# Patient Record
Sex: Female | Born: 1935 | Race: White | Hispanic: No | Marital: Married | Smoking: Former smoker
Health system: Southern US, Community
[De-identification: ages and names within clinical notes are randomized; demographics above are authoritative.]

## PROBLEM LIST (undated history)

## (undated) DIAGNOSIS — K5792 Diverticulitis of intestine, part unspecified, without perforation or abscess without bleeding: Secondary | ICD-10-CM

## (undated) DIAGNOSIS — E785 Hyperlipidemia, unspecified: Secondary | ICD-10-CM

## (undated) DIAGNOSIS — I2510 Atherosclerotic heart disease of native coronary artery without angina pectoris: Secondary | ICD-10-CM

## (undated) DIAGNOSIS — C50919 Malignant neoplasm of unspecified site of unspecified female breast: Secondary | ICD-10-CM

## (undated) DIAGNOSIS — I10 Essential (primary) hypertension: Secondary | ICD-10-CM

## (undated) DIAGNOSIS — J449 Chronic obstructive pulmonary disease, unspecified: Secondary | ICD-10-CM

## (undated) HISTORY — PX: COLOSTOMY: SHX63

## (undated) HISTORY — PX: TOTAL KNEE ARTHROPLASTY: SHX125

## (undated) HISTORY — DX: Atherosclerotic heart disease of native coronary artery without angina pectoris: I25.10

## (undated) HISTORY — PX: MASTECTOMY: SHX3

## (undated) HISTORY — DX: Chronic obstructive pulmonary disease, unspecified: J44.9

## (undated) HISTORY — DX: Essential (primary) hypertension: I10

## (undated) HISTORY — DX: Hyperlipidemia, unspecified: E78.5

## (undated) HISTORY — PX: CORONARY ARTERY BYPASS GRAFT: SHX141

## (undated) HISTORY — PX: CHOLECYSTECTOMY: SHX55

## (undated) HISTORY — PX: THYROIDECTOMY: SHX17

## (undated) HISTORY — DX: Malignant neoplasm of unspecified site of unspecified female breast: C50.919

## (undated) HISTORY — DX: Diverticulitis of intestine, part unspecified, without perforation or abscess without bleeding: K57.92

---

## 1997-08-06 ENCOUNTER — Emergency Department (HOSPITAL_COMMUNITY): Admission: EM | Admit: 1997-08-06 | Discharge: 1997-08-06 | Payer: Self-pay | Admitting: Emergency Medicine

## 1998-01-06 ENCOUNTER — Ambulatory Visit (HOSPITAL_COMMUNITY): Admission: RE | Admit: 1998-01-06 | Discharge: 1998-01-06 | Payer: Self-pay | Admitting: *Deleted

## 1998-01-06 ENCOUNTER — Encounter: Payer: Self-pay | Admitting: *Deleted

## 1998-03-22 ENCOUNTER — Ambulatory Visit (HOSPITAL_COMMUNITY): Admission: RE | Admit: 1998-03-22 | Discharge: 1998-03-22 | Payer: Self-pay | Admitting: *Deleted

## 1998-03-22 ENCOUNTER — Encounter: Payer: Self-pay | Admitting: *Deleted

## 1998-05-01 ENCOUNTER — Encounter: Payer: Self-pay | Admitting: Internal Medicine

## 1998-05-01 ENCOUNTER — Inpatient Hospital Stay (HOSPITAL_COMMUNITY): Admission: EM | Admit: 1998-05-01 | Discharge: 1998-05-03 | Payer: Self-pay | Admitting: Emergency Medicine

## 1998-09-30 ENCOUNTER — Emergency Department (HOSPITAL_COMMUNITY): Admission: EM | Admit: 1998-09-30 | Discharge: 1998-09-30 | Payer: Self-pay | Admitting: Emergency Medicine

## 1998-09-30 ENCOUNTER — Encounter: Payer: Self-pay | Admitting: *Deleted

## 1998-12-15 ENCOUNTER — Encounter: Payer: Self-pay | Admitting: Endocrinology

## 1998-12-15 ENCOUNTER — Ambulatory Visit (HOSPITAL_COMMUNITY): Admission: RE | Admit: 1998-12-15 | Discharge: 1998-12-15 | Payer: Self-pay | Admitting: Internal Medicine

## 2001-02-12 ENCOUNTER — Inpatient Hospital Stay (HOSPITAL_COMMUNITY): Admission: RE | Admit: 2001-02-12 | Discharge: 2001-02-18 | Payer: Self-pay | Admitting: Orthopedic Surgery

## 2001-02-18 ENCOUNTER — Inpatient Hospital Stay (HOSPITAL_COMMUNITY)
Admission: RE | Admit: 2001-02-18 | Discharge: 2001-02-21 | Payer: Self-pay | Admitting: Physical Medicine & Rehabilitation

## 2001-08-03 ENCOUNTER — Emergency Department (HOSPITAL_COMMUNITY): Admission: EM | Admit: 2001-08-03 | Discharge: 2001-08-03 | Payer: Self-pay | Admitting: Emergency Medicine

## 2002-04-05 ENCOUNTER — Emergency Department (HOSPITAL_COMMUNITY): Admission: EM | Admit: 2002-04-05 | Discharge: 2002-04-06 | Payer: Self-pay | Admitting: Emergency Medicine

## 2002-05-09 ENCOUNTER — Encounter: Payer: Self-pay | Admitting: Emergency Medicine

## 2002-05-09 ENCOUNTER — Inpatient Hospital Stay (HOSPITAL_COMMUNITY): Admission: EM | Admit: 2002-05-09 | Discharge: 2002-05-20 | Payer: Self-pay | Admitting: Emergency Medicine

## 2002-05-10 ENCOUNTER — Encounter: Payer: Self-pay | Admitting: Thoracic Surgery (Cardiothoracic Vascular Surgery)

## 2002-05-10 ENCOUNTER — Encounter: Payer: Self-pay | Admitting: *Deleted

## 2002-05-11 ENCOUNTER — Encounter: Payer: Self-pay | Admitting: Cardiothoracic Surgery

## 2002-05-12 ENCOUNTER — Encounter: Payer: Self-pay | Admitting: Cardiothoracic Surgery

## 2002-05-13 ENCOUNTER — Encounter: Payer: Self-pay | Admitting: Cardiothoracic Surgery

## 2002-05-14 ENCOUNTER — Encounter: Payer: Self-pay | Admitting: Cardiothoracic Surgery

## 2002-05-15 ENCOUNTER — Encounter: Payer: Self-pay | Admitting: Cardiothoracic Surgery

## 2002-06-04 ENCOUNTER — Encounter: Admission: RE | Admit: 2002-06-04 | Discharge: 2002-06-04 | Payer: Self-pay | Admitting: Cardiothoracic Surgery

## 2002-06-04 ENCOUNTER — Encounter: Payer: Self-pay | Admitting: Cardiothoracic Surgery

## 2002-11-15 ENCOUNTER — Emergency Department (HOSPITAL_COMMUNITY): Admission: EM | Admit: 2002-11-15 | Discharge: 2002-11-15 | Payer: Self-pay | Admitting: Emergency Medicine

## 2003-08-09 ENCOUNTER — Emergency Department (HOSPITAL_COMMUNITY): Admission: EM | Admit: 2003-08-09 | Discharge: 2003-08-10 | Payer: Self-pay | Admitting: Emergency Medicine

## 2003-08-10 ENCOUNTER — Encounter: Admission: RE | Admit: 2003-08-10 | Discharge: 2003-08-10 | Payer: Self-pay | Admitting: Orthopedic Surgery

## 2003-08-15 ENCOUNTER — Inpatient Hospital Stay (HOSPITAL_COMMUNITY): Admission: EM | Admit: 2003-08-15 | Discharge: 2003-08-19 | Payer: Self-pay | Admitting: *Deleted

## 2003-08-18 ENCOUNTER — Encounter (INDEPENDENT_AMBULATORY_CARE_PROVIDER_SITE_OTHER): Payer: Self-pay | Admitting: Specialist

## 2003-10-15 ENCOUNTER — Ambulatory Visit (HOSPITAL_COMMUNITY): Admission: RE | Admit: 2003-10-15 | Discharge: 2003-10-15 | Payer: Self-pay | Admitting: Endocrinology

## 2003-12-20 ENCOUNTER — Other Ambulatory Visit: Admission: RE | Admit: 2003-12-20 | Discharge: 2004-01-19 | Payer: Self-pay | Admitting: Cardiology

## 2004-05-10 ENCOUNTER — Encounter: Admission: RE | Admit: 2004-05-10 | Discharge: 2004-05-10 | Payer: Self-pay | Admitting: *Deleted

## 2004-06-18 ENCOUNTER — Inpatient Hospital Stay (HOSPITAL_COMMUNITY): Admission: EM | Admit: 2004-06-18 | Discharge: 2004-06-23 | Payer: Self-pay | Admitting: Emergency Medicine

## 2004-06-21 ENCOUNTER — Encounter (INDEPENDENT_AMBULATORY_CARE_PROVIDER_SITE_OTHER): Payer: Self-pay | Admitting: *Deleted

## 2004-07-06 ENCOUNTER — Encounter: Payer: Self-pay | Admitting: Interventional Radiology

## 2004-08-04 ENCOUNTER — Inpatient Hospital Stay (HOSPITAL_COMMUNITY): Admission: AD | Admit: 2004-08-04 | Discharge: 2004-08-06 | Payer: Self-pay | Admitting: Internal Medicine

## 2004-09-19 ENCOUNTER — Emergency Department (HOSPITAL_COMMUNITY): Admission: EM | Admit: 2004-09-19 | Discharge: 2004-09-19 | Payer: Self-pay | Admitting: *Deleted

## 2005-04-12 ENCOUNTER — Emergency Department (HOSPITAL_COMMUNITY): Admission: EM | Admit: 2005-04-12 | Discharge: 2005-04-12 | Payer: Self-pay | Admitting: Emergency Medicine

## 2005-07-12 IMAGING — CR DG THORACIC SPINE 2V
4 series · 4 of 4 positions shown · non-contrast
Comparison: Chest CT of same day and chest radiograph of 06/04/02.

CLINICAL DATA: Urinary tract infection.  Midthoracic back pain that radiates to front of chest. 
 THORACIC SPINE - 4 VIEW - 06/18/04:

[view not recorded (1 of 4)]
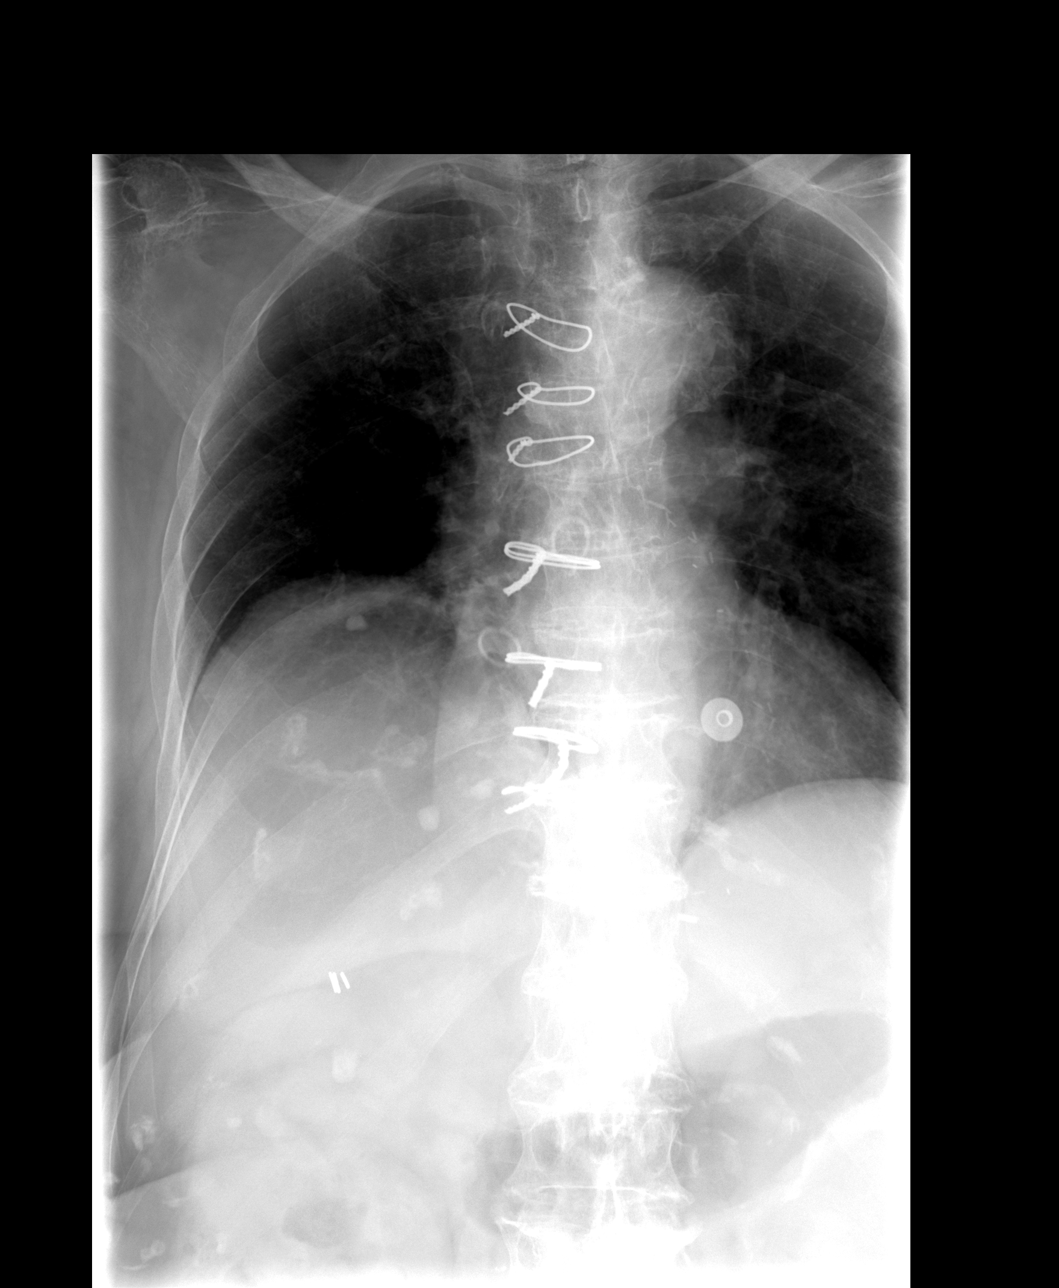

[view not recorded (2 of 4)]
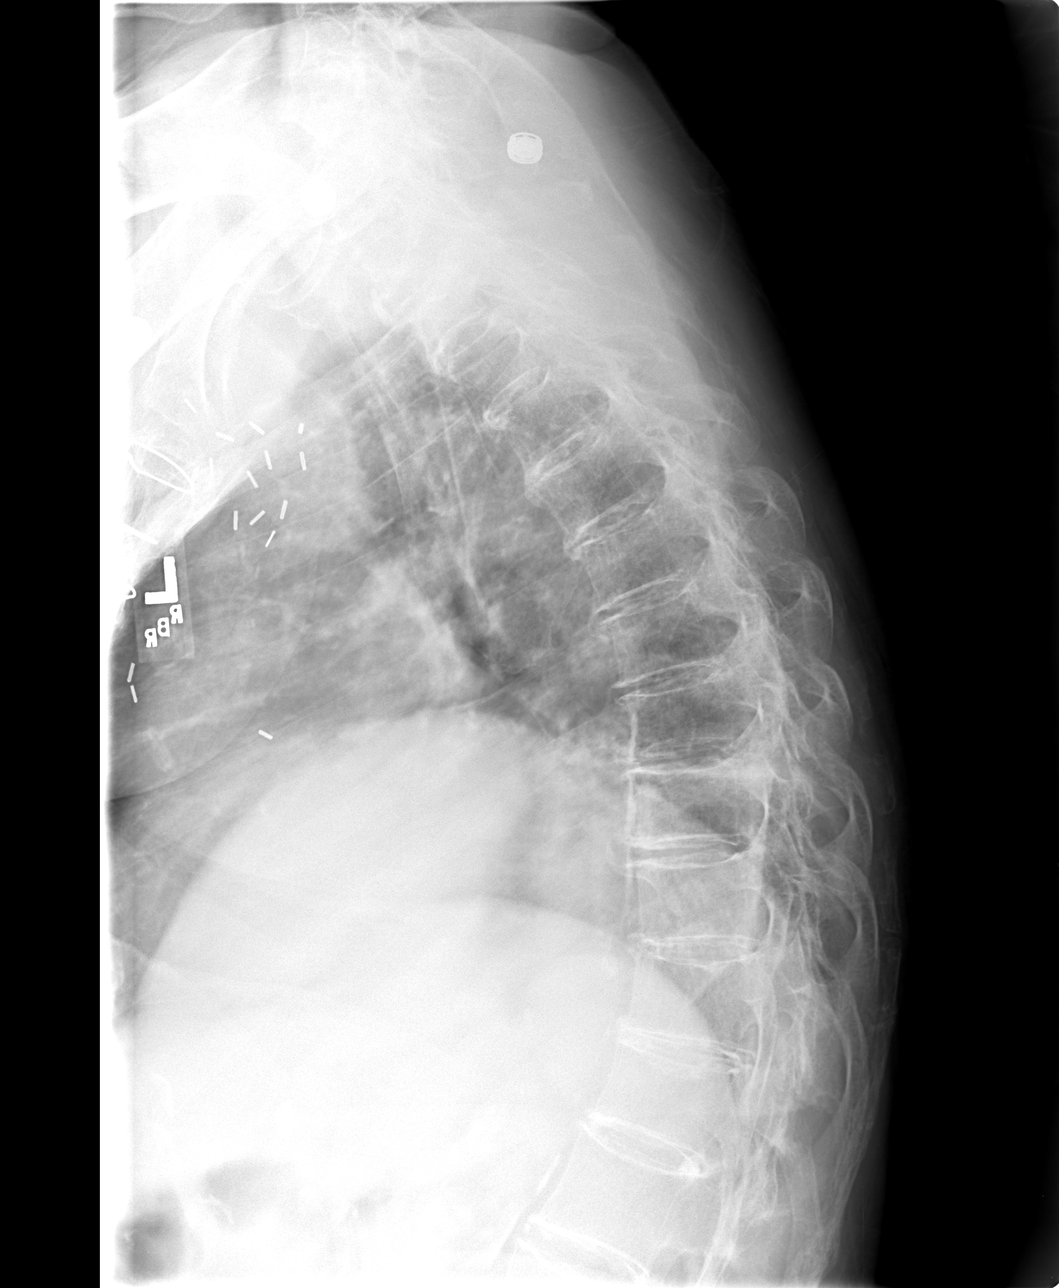

[view not recorded (3 of 4)]
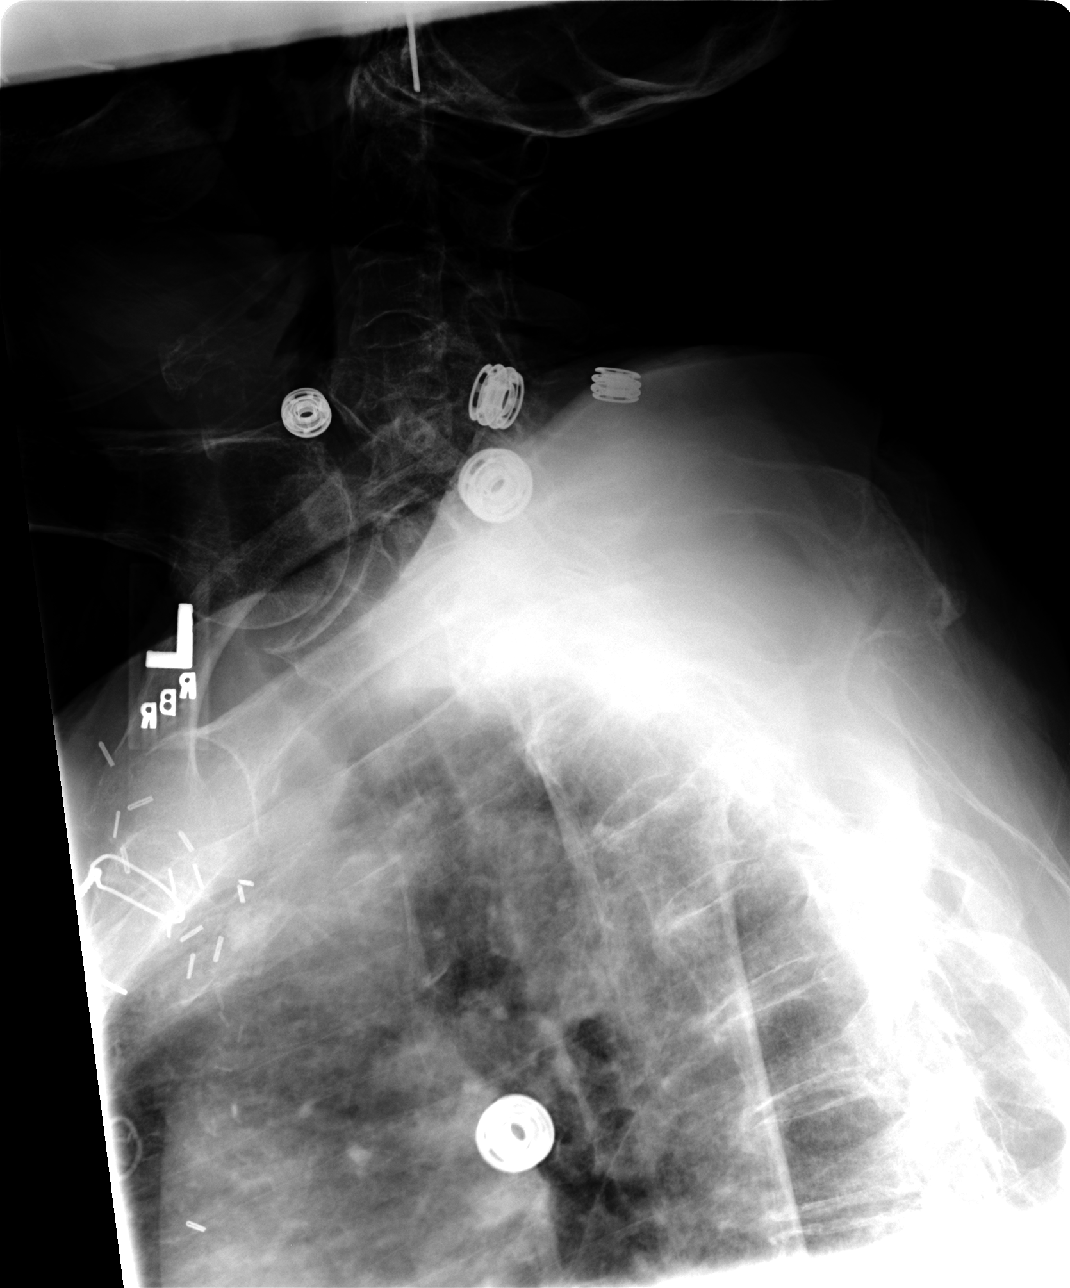

[view not recorded (4 of 4)]
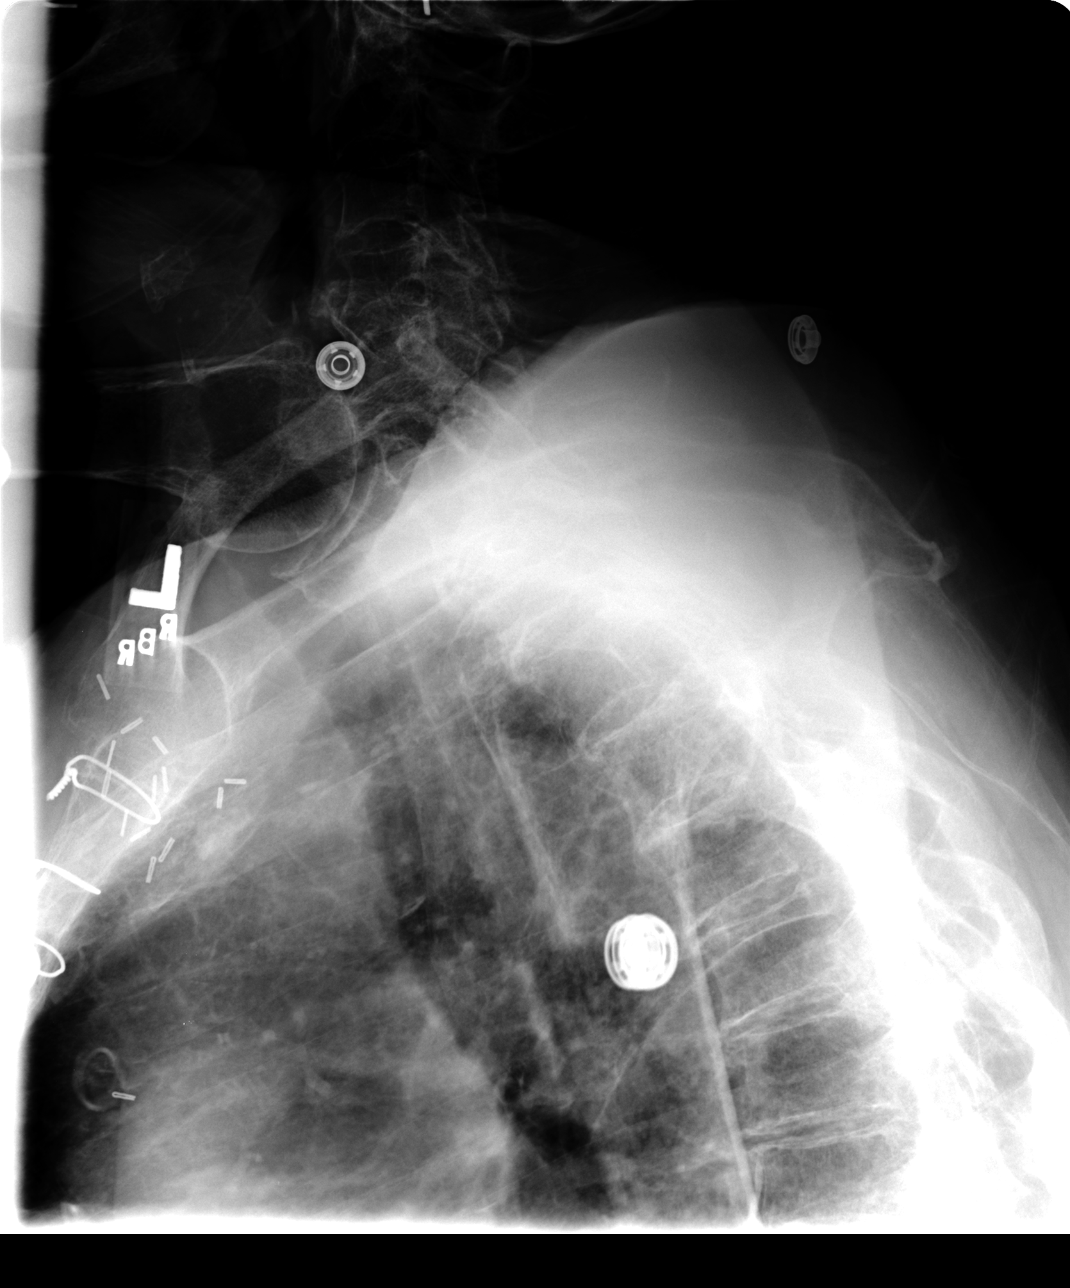

[4 of 4 positions shown; findings below may reference images not displayed]

FINDINGS: The patient is status post median sternotomy for CABG.  Venous graft markers are in place.  Seven median sternotomy wires are intact.  Multiple surgical clips are noted in the region of the left internal mammary artery.   There is some exaggeration of the normal thoracic kyphosis without focal fracture.  Atherosclerotic changes are noted in the thoracic aorta.
IMPRESSION: 1.  Degenerative changes of the thoracic spine.  Exaggerated kyphosis but no acute fracture. 
 2.  Previous median sternotomy for CABG. 
 3.  Status post cholecystectomy.

## 2005-07-12 IMAGING — CR DG LUMBAR SPINE 2-3V
3 series · 3 of 3 positions shown · non-contrast
Comparison: MRI lumbar spine 08/10/2003.

CLINICAL DATA: History of L3 compression fracture with vertebroplasty. Now with
mid and lower back pain.

LUMBAR SPINE - 3 VIEW 06/18/2004:

[view not recorded (1 of 3)]
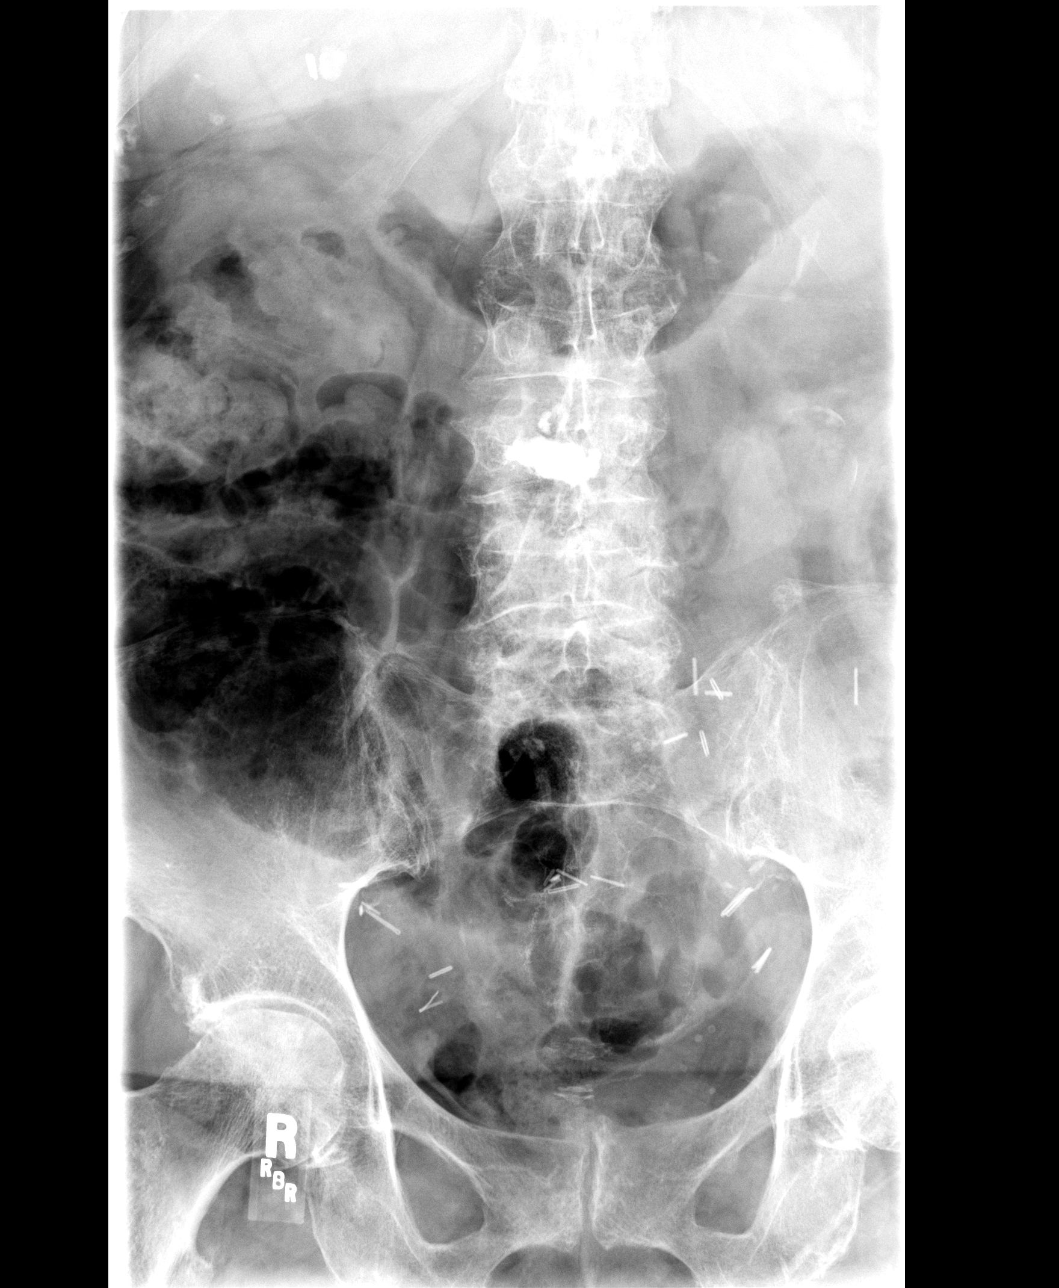

[view not recorded (2 of 3)]
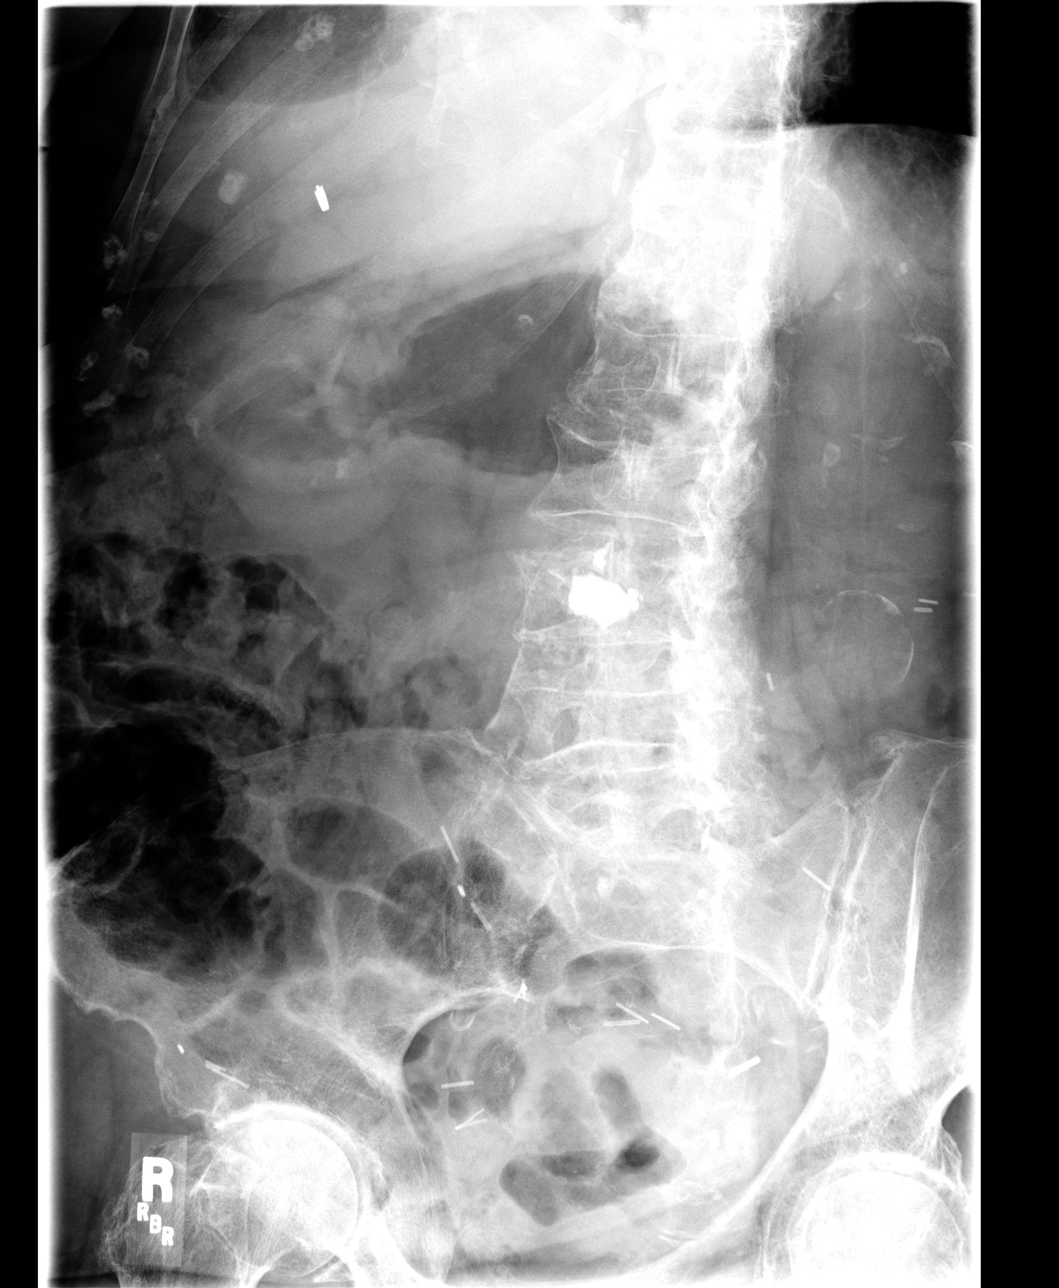

[view not recorded (3 of 3)]
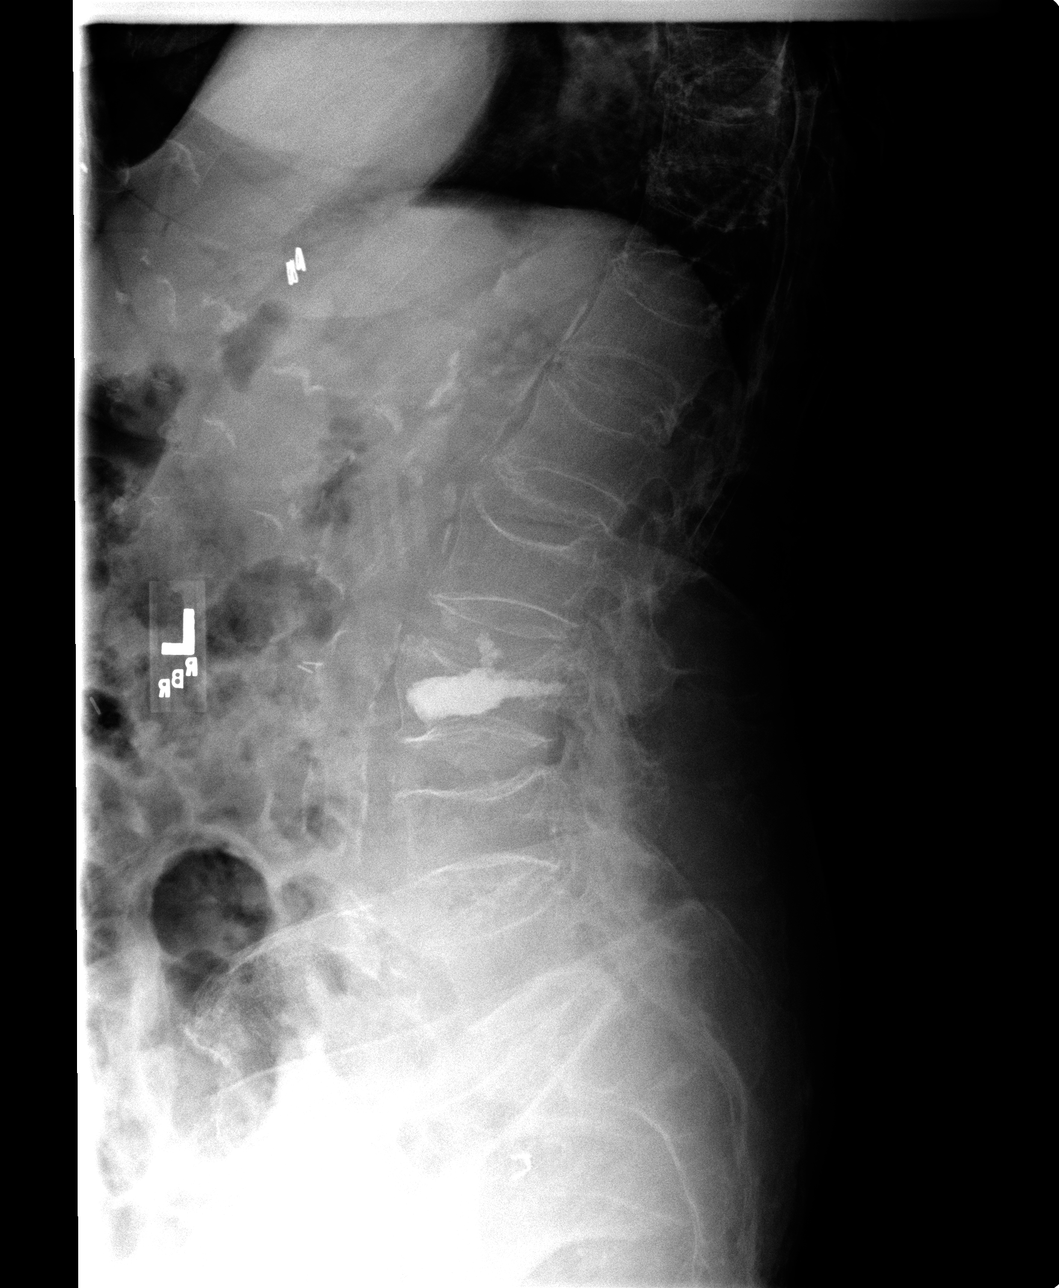

[3 of 3 positions shown; findings below may reference images not displayed]

I will use the same numbering scheme
where the last lumbar segment is designated L5.
FINDINGS: Since the prior MRI, patient has developed compression fractures of
the L4 vertebral body (40-50%), the upper endplate of L2 (approximately 30%),
the L1 body (approximately 40%), the T12 body (approximately 50%), the T11 body
(50-60%), and the T10 body (approximately 50%). The fracture at L3 has been
stabilized after vertebroplasty. Posterior alignment remains anatomic. Severe
diffuse osteopenia is noted.
IMPRESSION: Multiple new compression fractures since the prior MRI July 2003 as detailed
above.

## 2006-07-01 ENCOUNTER — Emergency Department (HOSPITAL_COMMUNITY): Admission: EM | Admit: 2006-07-01 | Discharge: 2006-07-01 | Payer: Self-pay | Admitting: Emergency Medicine

## 2006-09-26 ENCOUNTER — Emergency Department (HOSPITAL_COMMUNITY): Admission: EM | Admit: 2006-09-26 | Discharge: 2006-09-26 | Payer: Self-pay | Admitting: Emergency Medicine

## 2007-06-06 ENCOUNTER — Encounter (INDEPENDENT_AMBULATORY_CARE_PROVIDER_SITE_OTHER): Payer: Self-pay | Admitting: Radiology

## 2007-06-06 ENCOUNTER — Encounter: Admission: RE | Admit: 2007-06-06 | Discharge: 2007-06-06 | Payer: Self-pay | Admitting: General Surgery

## 2007-06-18 ENCOUNTER — Ambulatory Visit: Payer: Self-pay | Admitting: Oncology

## 2007-06-25 LAB — CANCER ANTIGEN 27.29: CA 27.29: 21 U/mL (ref 0–39)

## 2007-06-25 LAB — CBC WITH DIFFERENTIAL/PLATELET
Basophils Absolute: 0.1 10*3/uL (ref 0.0–0.1)
EOS%: 5.3 % (ref 0.0–7.0)
HCT: 38.6 % (ref 34.8–46.6)
HGB: 13.3 g/dL (ref 11.6–15.9)
LYMPH%: 23.4 % (ref 14.0–48.0)
MCH: 32.2 pg (ref 26.0–34.0)
MCV: 93.5 fL (ref 81.0–101.0)
MONO%: 8.1 % (ref 0.0–13.0)
NEUT%: 62.4 % (ref 39.6–76.8)
Platelets: 271 10*3/uL (ref 145–400)

## 2007-06-25 LAB — COMPREHENSIVE METABOLIC PANEL
AST: 19 U/L (ref 0–37)
Alkaline Phosphatase: 14 U/L — ABNORMAL LOW (ref 39–117)
BUN: 42 mg/dL — ABNORMAL HIGH (ref 6–23)
Creatinine, Ser: 2.36 mg/dL — ABNORMAL HIGH (ref 0.40–1.20)
Glucose, Bld: 91 mg/dL (ref 70–99)

## 2007-06-26 LAB — VITAMIN D 25 HYDROXY (VIT D DEFICIENCY, FRACTURES): Vit D, 25-Hydroxy: 26 ng/mL — ABNORMAL LOW (ref 30–89)

## 2007-06-27 ENCOUNTER — Encounter: Admission: RE | Admit: 2007-06-27 | Discharge: 2007-06-27 | Payer: Self-pay | Admitting: Oncology

## 2007-07-31 ENCOUNTER — Ambulatory Visit: Payer: Self-pay | Admitting: Oncology

## 2007-08-05 LAB — CBC WITH DIFFERENTIAL/PLATELET
BASO%: 0.3 % (ref 0.0–2.0)
Basophils Absolute: 0 10*3/uL (ref 0.0–0.1)
EOS%: 4.9 % (ref 0.0–7.0)
HCT: 37.2 % (ref 34.8–46.6)
HGB: 13.1 g/dL (ref 11.6–15.9)
LYMPH%: 20.4 % (ref 14.0–48.0)
MCH: 33.1 pg (ref 26.0–34.0)
MCHC: 35.3 g/dL (ref 32.0–36.0)
MCV: 93.7 fL (ref 81.0–101.0)
MONO%: 7.6 % (ref 0.0–13.0)
NEUT%: 66.8 % (ref 39.6–76.8)
Platelets: 257 10*3/uL (ref 145–400)
lymph#: 1.4 10*3/uL (ref 0.9–3.3)

## 2007-08-06 LAB — COMPREHENSIVE METABOLIC PANEL
AST: 15 U/L (ref 0–37)
BUN: 34 mg/dL — ABNORMAL HIGH (ref 6–23)
Calcium: 9.1 mg/dL (ref 8.4–10.5)
Chloride: 103 mEq/L (ref 96–112)
Creatinine, Ser: 2.24 mg/dL — ABNORMAL HIGH (ref 0.40–1.20)
Total Bilirubin: 0.7 mg/dL (ref 0.3–1.2)

## 2007-08-06 LAB — VITAMIN D 25 HYDROXY (VIT D DEFICIENCY, FRACTURES): Vit D, 25-Hydroxy: 35 ng/mL (ref 30–89)

## 2007-09-15 ENCOUNTER — Ambulatory Visit: Payer: Self-pay | Admitting: Oncology

## 2007-09-18 LAB — CBC WITH DIFFERENTIAL/PLATELET
Basophils Absolute: 0.1 10*3/uL (ref 0.0–0.1)
Eosinophils Absolute: 0.4 10*3/uL (ref 0.0–0.5)
HCT: 41 % (ref 34.8–46.6)
HGB: 13.8 g/dL (ref 11.6–15.9)
MCV: 96 fL (ref 81.0–101.0)
MONO%: 6.8 % (ref 0.0–13.0)
NEUT#: 3.7 10*3/uL (ref 1.5–6.5)
NEUT%: 63.9 % (ref 39.6–76.8)
Platelets: 245 10*3/uL (ref 145–400)
RDW: 12.9 % (ref 11.3–14.5)

## 2007-09-18 LAB — BASIC METABOLIC PANEL
BUN: 34 mg/dL — ABNORMAL HIGH (ref 6–23)
Chloride: 103 mEq/L (ref 96–112)
Creatinine, Ser: 2.09 mg/dL — ABNORMAL HIGH (ref 0.40–1.20)
Glucose, Bld: 100 mg/dL — ABNORMAL HIGH (ref 70–99)
Potassium: 4.4 mEq/L (ref 3.5–5.3)

## 2007-10-30 LAB — CBC WITH DIFFERENTIAL/PLATELET
BASO%: 0.7 % (ref 0.0–2.0)
Basophils Absolute: 0 10*3/uL (ref 0.0–0.1)
EOS%: 7.4 % — ABNORMAL HIGH (ref 0.0–7.0)
HCT: 39.2 % (ref 34.8–46.6)
HGB: 13.5 g/dL (ref 11.6–15.9)
LYMPH%: 19.2 % (ref 14.0–48.0)
MCH: 32.6 pg (ref 26.0–34.0)
MCHC: 34.3 g/dL (ref 32.0–36.0)
MCV: 95 fL (ref 81.0–101.0)
MONO%: 7 % (ref 0.0–13.0)
NEUT%: 65.7 % (ref 39.6–76.8)

## 2007-10-30 LAB — COMPREHENSIVE METABOLIC PANEL
ALT: 10 U/L (ref 0–35)
AST: 17 U/L (ref 0–37)
Alkaline Phosphatase: 10 U/L — ABNORMAL LOW (ref 39–117)
BUN: 25 mg/dL — ABNORMAL HIGH (ref 6–23)
Calcium: 9.2 mg/dL (ref 8.4–10.5)
Creatinine, Ser: 2.05 mg/dL — ABNORMAL HIGH (ref 0.40–1.20)
Total Bilirubin: 0.6 mg/dL (ref 0.3–1.2)

## 2007-11-27 ENCOUNTER — Ambulatory Visit: Payer: Self-pay | Admitting: Oncology

## 2007-12-01 LAB — COMPREHENSIVE METABOLIC PANEL
Albumin: 3.9 g/dL (ref 3.5–5.2)
CO2: 25 mEq/L (ref 19–32)
Glucose, Bld: 93 mg/dL (ref 70–99)
Sodium: 142 mEq/L (ref 135–145)
Total Bilirubin: 0.5 mg/dL (ref 0.3–1.2)
Total Protein: 7.4 g/dL (ref 6.0–8.3)

## 2007-12-01 LAB — CBC WITH DIFFERENTIAL/PLATELET
Eosinophils Absolute: 0.3 10*3/uL (ref 0.0–0.5)
HCT: 38 % (ref 34.8–46.6)
HGB: 13.1 g/dL (ref 11.6–15.9)
LYMPH%: 20.7 % (ref 14.0–48.0)
MONO#: 0.5 10*3/uL (ref 0.1–0.9)
NEUT#: 4.1 10*3/uL (ref 1.5–6.5)
Platelets: 253 10*3/uL (ref 145–400)
RBC: 4.01 10*6/uL (ref 3.70–5.32)
WBC: 6.2 10*3/uL (ref 3.9–10.0)

## 2007-12-01 LAB — CANCER ANTIGEN 27.29: CA 27.29: 22 U/mL (ref 0–39)

## 2008-03-26 ENCOUNTER — Ambulatory Visit: Payer: Self-pay | Admitting: Oncology

## 2008-04-30 LAB — COMPREHENSIVE METABOLIC PANEL
Alkaline Phosphatase: 12 U/L — ABNORMAL LOW (ref 39–117)
BUN: 32 mg/dL — ABNORMAL HIGH (ref 6–23)
Creatinine, Ser: 2.07 mg/dL — ABNORMAL HIGH (ref 0.40–1.20)
Glucose, Bld: 88 mg/dL (ref 70–99)
Total Bilirubin: 0.5 mg/dL (ref 0.3–1.2)

## 2008-04-30 LAB — CBC WITH DIFFERENTIAL/PLATELET
Basophils Absolute: 0 10*3/uL (ref 0.0–0.1)
Eosinophils Absolute: 0.3 10*3/uL (ref 0.0–0.5)
HGB: 13.4 g/dL (ref 11.6–15.9)
LYMPH%: 22 % (ref 14.0–49.7)
MCH: 32.3 pg (ref 25.1–34.0)
MCV: 94.4 fL (ref 79.5–101.0)
MONO%: 7.2 % (ref 0.0–14.0)
NEUT#: 4.4 10*3/uL (ref 1.5–6.5)
Platelets: 244 10*3/uL (ref 145–400)

## 2008-07-10 ENCOUNTER — Inpatient Hospital Stay (HOSPITAL_COMMUNITY): Admission: EM | Admit: 2008-07-10 | Discharge: 2008-07-11 | Payer: Self-pay | Admitting: Emergency Medicine

## 2008-10-27 ENCOUNTER — Ambulatory Visit: Payer: Self-pay | Admitting: Oncology

## 2008-10-29 LAB — COMPREHENSIVE METABOLIC PANEL
ALT: 13 U/L (ref 0–35)
AST: 25 U/L (ref 0–37)
Albumin: 3.7 g/dL (ref 3.5–5.2)
Alkaline Phosphatase: 11 U/L — ABNORMAL LOW (ref 39–117)
BUN: 44 mg/dL — ABNORMAL HIGH (ref 6–23)
Creatinine, Ser: 2.51 mg/dL — ABNORMAL HIGH (ref 0.40–1.20)
Potassium: 4.7 mEq/L (ref 3.5–5.3)

## 2008-10-29 LAB — CBC WITH DIFFERENTIAL/PLATELET
BASO%: 0.6 % (ref 0.0–2.0)
Eosinophils Absolute: 0.3 10*3/uL (ref 0.0–0.5)
LYMPH%: 20.6 % (ref 14.0–49.7)
MCHC: 34.8 g/dL (ref 31.5–36.0)
MONO#: 0.4 10*3/uL (ref 0.1–0.9)
NEUT#: 4.3 10*3/uL (ref 1.5–6.5)
Platelets: 255 10*3/uL (ref 145–400)
RBC: 4.01 10*6/uL (ref 3.70–5.45)
RDW: 13.5 % (ref 11.2–14.5)
WBC: 6.2 10*3/uL (ref 3.9–10.3)

## 2008-10-30 LAB — CANCER ANTIGEN 27.29: CA 27.29: 18 U/mL (ref 0–39)

## 2008-10-30 LAB — VITAMIN D 25 HYDROXY (VIT D DEFICIENCY, FRACTURES): Vit D, 25-Hydroxy: 22 ng/mL — ABNORMAL LOW (ref 30–89)

## 2008-11-03 ENCOUNTER — Encounter: Admission: RE | Admit: 2008-11-03 | Discharge: 2008-11-03 | Payer: Self-pay | Admitting: Oncology

## 2009-04-18 ENCOUNTER — Ambulatory Visit: Payer: Self-pay | Admitting: Oncology

## 2009-04-20 LAB — COMPREHENSIVE METABOLIC PANEL
ALT: 10 U/L (ref 0–35)
AST: 18 U/L (ref 0–37)
Albumin: 4.4 g/dL (ref 3.5–5.2)
BUN: 37 mg/dL — ABNORMAL HIGH (ref 6–23)
CO2: 25 mEq/L (ref 19–32)
Calcium: 9.1 mg/dL (ref 8.4–10.5)
Chloride: 100 mEq/L (ref 96–112)
Potassium: 4.1 mEq/L (ref 3.5–5.3)

## 2009-04-20 LAB — CBC WITH DIFFERENTIAL/PLATELET
BASO%: 0.4 % (ref 0.0–2.0)
Basophils Absolute: 0 10*3/uL (ref 0.0–0.1)
EOS%: 4.8 % (ref 0.0–7.0)
HCT: 38.1 % (ref 34.8–46.6)
HGB: 13.2 g/dL (ref 11.6–15.9)
MCH: 33.3 pg (ref 25.1–34.0)
MONO#: 0.5 10*3/uL (ref 0.1–0.9)
NEUT%: 65.2 % (ref 38.4–76.8)
RDW: 13.9 % (ref 11.2–14.5)
WBC: 7.1 10*3/uL (ref 3.9–10.3)
lymph#: 1.5 10*3/uL (ref 0.9–3.3)

## 2009-04-20 LAB — CANCER ANTIGEN 27.29: CA 27.29: 19 U/mL (ref 0–39)

## 2009-07-20 ENCOUNTER — Ambulatory Visit: Payer: Self-pay | Admitting: Cardiovascular Disease

## 2009-07-20 ENCOUNTER — Inpatient Hospital Stay (HOSPITAL_COMMUNITY): Admission: EM | Admit: 2009-07-20 | Discharge: 2009-07-22 | Payer: Self-pay | Admitting: Emergency Medicine

## 2009-09-02 ENCOUNTER — Ambulatory Visit: Payer: Self-pay | Admitting: Cardiology

## 2009-09-16 ENCOUNTER — Ambulatory Visit: Payer: Self-pay | Admitting: Cardiology

## 2009-11-01 ENCOUNTER — Ambulatory Visit: Payer: Self-pay | Admitting: Oncology

## 2009-11-03 LAB — COMPREHENSIVE METABOLIC PANEL
Albumin: 3.9 g/dL (ref 3.5–5.2)
BUN: 34 mg/dL — ABNORMAL HIGH (ref 6–23)
CO2: 21 mEq/L (ref 19–32)
Calcium: 9 mg/dL (ref 8.4–10.5)
Chloride: 104 mEq/L (ref 96–112)
Glucose, Bld: 132 mg/dL — ABNORMAL HIGH (ref 70–99)
Potassium: 4.2 mEq/L (ref 3.5–5.3)
Sodium: 140 mEq/L (ref 135–145)
Total Protein: 7.1 g/dL (ref 6.0–8.3)

## 2009-11-03 LAB — CBC WITH DIFFERENTIAL/PLATELET
Basophils Absolute: 0 10*3/uL (ref 0.0–0.1)
Eosinophils Absolute: 0.3 10*3/uL (ref 0.0–0.5)
HGB: 12.8 g/dL (ref 11.6–15.9)
MCV: 94.8 fL (ref 79.5–101.0)
MONO#: 0.5 10*3/uL (ref 0.1–0.9)
MONO%: 7.8 % (ref 0.0–14.0)
NEUT#: 3.7 10*3/uL (ref 1.5–6.5)
RBC: 3.78 10*6/uL (ref 3.70–5.45)
RDW: 13.9 % (ref 11.2–14.5)
WBC: 5.9 10*3/uL (ref 3.9–10.3)

## 2009-11-03 LAB — LACTATE DEHYDROGENASE: LDH: 139 U/L (ref 94–250)

## 2009-11-03 LAB — VITAMIN D 25 HYDROXY (VIT D DEFICIENCY, FRACTURES): Vit D, 25-Hydroxy: 29 ng/mL — ABNORMAL LOW (ref 30–89)

## 2009-11-14 LAB — BASIC METABOLIC PANEL
CO2: 30 mEq/L (ref 19–32)
Calcium: 10 mg/dL (ref 8.4–10.5)
Creatinine, Ser: 2.11 mg/dL — ABNORMAL HIGH (ref 0.40–1.20)
Sodium: 138 mEq/L (ref 135–145)

## 2010-02-05 ENCOUNTER — Encounter: Payer: Self-pay | Admitting: Oncology

## 2010-04-02 LAB — URINALYSIS, ROUTINE W REFLEX MICROSCOPIC
Bilirubin Urine: NEGATIVE
Glucose, UA: NEGATIVE mg/dL
Ketones, ur: NEGATIVE mg/dL
Protein, ur: 30 mg/dL — AB
pH: 7 (ref 5.0–8.0)

## 2010-04-02 LAB — BASIC METABOLIC PANEL
BUN: 22 mg/dL (ref 6–23)
Calcium: 8.5 mg/dL (ref 8.4–10.5)
GFR calc non Af Amer: 29 mL/min — ABNORMAL LOW (ref >60)
Glucose, Bld: 94 mg/dL (ref 70–99)
Sodium: 136 mEq/L (ref 135–145)

## 2010-04-02 LAB — COMPREHENSIVE METABOLIC PANEL
ALT: 11 U/L (ref 0–35)
AST: 21 U/L (ref 0–37)
Alkaline Phosphatase: 12 U/L — ABNORMAL LOW (ref 39–117)
CO2: 25 mEq/L (ref 19–32)
Calcium: 9.1 mg/dL (ref 8.4–10.5)
Chloride: 101 mEq/L (ref 96–112)
GFR calc Af Amer: 32 mL/min — ABNORMAL LOW (ref >60)
GFR calc non Af Amer: 27 mL/min — ABNORMAL LOW (ref >60)
Potassium: 4.2 mEq/L (ref 3.5–5.1)
Sodium: 136 mEq/L (ref 135–145)

## 2010-04-02 LAB — URINE MICROSCOPIC-ADD ON

## 2010-04-02 LAB — DIFFERENTIAL
Eosinophils Relative: 4 % (ref 0–5)
Lymphs Abs: 1.4 10*3/uL (ref 0.7–4.0)
Monocytes Absolute: 0.7 10*3/uL (ref 0.1–1.0)

## 2010-04-02 LAB — CARDIAC PANEL(CRET KIN+CKTOT+MB+TROPI)
CK, MB: 2.6 ng/mL (ref 0.3–4.0)
Relative Index: 1.9 (ref 0.0–2.5)

## 2010-04-02 LAB — CBC
Hemoglobin: 13.6 g/dL (ref 12.0–15.0)
MCHC: 33.9 g/dL (ref 30.0–36.0)
RBC: 4.19 MIL/uL (ref 3.87–5.11)
WBC: 7.5 10*3/uL (ref 4.0–10.5)

## 2010-04-02 LAB — POCT CARDIAC MARKERS
CKMB, poc: 1.4 ng/mL (ref 1.0–8.0)
Troponin i, poc: <0.05 ng/mL (ref 0.00–0.09)

## 2010-04-02 LAB — CK TOTAL AND CKMB (NOT AT ARMC)
CK, MB: 2.4 ng/mL (ref 0.3–4.0)
Total CK: 78 U/L (ref 7–177)

## 2010-04-24 LAB — POCT I-STAT, CHEM 8
HCT: 37 % (ref 36.0–46.0)
Hemoglobin: 12.6 g/dL (ref 12.0–15.0)
Potassium: 3.9 mEq/L (ref 3.5–5.1)
Sodium: 141 mEq/L (ref 135–145)
TCO2: 26 mmol/L (ref 0–100)

## 2010-04-24 LAB — DIFFERENTIAL
Basophils Absolute: 0 10*3/uL (ref 0.0–0.1)
Basophils Relative: 1 % (ref 0–1)
Lymphocytes Relative: 20 % (ref 12–46)
Monocytes Absolute: 0.5 10*3/uL (ref 0.1–1.0)
Monocytes Relative: 8 % (ref 3–12)
Neutro Abs: 4.4 10*3/uL (ref 1.7–7.7)
Neutrophils Relative %: 66 % (ref 43–77)

## 2010-04-24 LAB — CBC
HCT: 36 % (ref 36.0–46.0)
Hemoglobin: 12.3 g/dL (ref 12.0–15.0)
MCV: 96.8 fL (ref 78.0–100.0)
Platelets: 232 10*3/uL (ref 150–400)
RDW: 14 % (ref 11.5–15.5)

## 2010-04-24 LAB — LIPID PANEL
Cholesterol: 143 mg/dL (ref 0–200)
LDL Cholesterol: 88 mg/dL (ref 0–99)
Triglycerides: 100 mg/dL (ref <150)

## 2010-04-24 LAB — PROTIME-INR
INR: 1 (ref 0.00–1.49)
Prothrombin Time: 13.7 seconds (ref 11.6–15.2)

## 2010-04-24 LAB — COMPREHENSIVE METABOLIC PANEL
Albumin: 3.2 g/dL — ABNORMAL LOW (ref 3.5–5.2)
BUN: 32 mg/dL — ABNORMAL HIGH (ref 6–23)
Creatinine, Ser: 1.97 mg/dL — ABNORMAL HIGH (ref 0.4–1.2)
Glucose, Bld: 136 mg/dL — ABNORMAL HIGH (ref 70–99)
Total Bilirubin: 0.4 mg/dL (ref 0.3–1.2)
Total Protein: 6.7 g/dL (ref 6.0–8.3)

## 2010-05-24 ENCOUNTER — Other Ambulatory Visit: Payer: Self-pay | Admitting: Oncology

## 2010-05-24 ENCOUNTER — Encounter (HOSPITAL_BASED_OUTPATIENT_CLINIC_OR_DEPARTMENT_OTHER): Payer: Medicare Other | Admitting: Oncology

## 2010-05-24 DIAGNOSIS — N289 Disorder of kidney and ureter, unspecified: Secondary | ICD-10-CM

## 2010-05-24 DIAGNOSIS — I10 Essential (primary) hypertension: Secondary | ICD-10-CM

## 2010-05-24 DIAGNOSIS — M810 Age-related osteoporosis without current pathological fracture: Secondary | ICD-10-CM

## 2010-05-24 DIAGNOSIS — C50919 Malignant neoplasm of unspecified site of unspecified female breast: Secondary | ICD-10-CM

## 2010-05-24 DIAGNOSIS — C50419 Malignant neoplasm of upper-outer quadrant of unspecified female breast: Secondary | ICD-10-CM

## 2010-05-24 LAB — CBC WITH DIFFERENTIAL/PLATELET
Basophils Absolute: 0 10*3/uL (ref 0.0–0.1)
Eosinophils Absolute: 0.3 10*3/uL (ref 0.0–0.5)
HGB: 12.9 g/dL (ref 11.6–15.9)
MCV: 95.7 fL (ref 79.5–101.0)
MONO#: 0.7 10*3/uL (ref 0.1–0.9)
MONO%: 10.2 % (ref 0.0–14.0)
NEUT#: 4.7 10*3/uL (ref 1.5–6.5)
RBC: 4.04 10*6/uL (ref 3.70–5.45)
RDW: 14.4 % (ref 11.2–14.5)
WBC: 6.9 10*3/uL (ref 3.9–10.3)

## 2010-05-25 ENCOUNTER — Other Ambulatory Visit: Payer: Self-pay | Admitting: Oncology

## 2010-05-25 DIAGNOSIS — Z853 Personal history of malignant neoplasm of breast: Secondary | ICD-10-CM

## 2010-05-25 DIAGNOSIS — Z780 Asymptomatic menopausal state: Secondary | ICD-10-CM

## 2010-05-25 DIAGNOSIS — N63 Unspecified lump in unspecified breast: Secondary | ICD-10-CM

## 2010-05-25 LAB — COMPREHENSIVE METABOLIC PANEL
Albumin: 4 g/dL (ref 3.5–5.2)
Alkaline Phosphatase: 14 U/L — ABNORMAL LOW (ref 39–117)
BUN: 40 mg/dL — ABNORMAL HIGH (ref 6–23)
CO2: 21 mEq/L (ref 19–32)
Calcium: 9.7 mg/dL (ref 8.4–10.5)
Chloride: 103 mEq/L (ref 96–112)
Glucose, Bld: 106 mg/dL — ABNORMAL HIGH (ref 70–99)
Potassium: 4.2 mEq/L (ref 3.5–5.3)
Sodium: 140 mEq/L (ref 135–145)
Total Protein: 7 g/dL (ref 6.0–8.3)

## 2010-05-25 LAB — VITAMIN D 25 HYDROXY (VIT D DEFICIENCY, FRACTURES): Vit D, 25-Hydroxy: 37 ng/mL (ref 30–89)

## 2010-05-30 NOTE — H&P (Signed)
NAME:  Veronica Bowman, Veronica Bowman                  ACCOUNT NO.:  [id number]   MEDICAL RECORD NO.:  [id number]          PATIENT TYPE:  INP   LOCATION:  3033                         FACILITY:  [facility]   PHYSICIAN:  Mark A. Perini, M.D.   DATE OF BIRTH:  Sep 07, 1918   DATE OF ADMISSION:  07/09/2008  DATE OF DISCHARGE:                              HISTORY & PHYSICAL   CHIEF COMPLAINT:  Slurred speech, facial numbness.   HISTORY OF PRESENT ILLNESS:  Aunna is a pleasant 77 year old female with  multiple medical problems as listed below.  She reports 1-2 weeks'  history of increased right hand weakness and her family reports some  drooping of her mouth on that side for the last 1-2 weeks.  Yesterday,  she was more lethargic, somewhat more short of breath, and she also had  increased numbness around the right side of her face and therefore  presented to the emergency room.  She will be admitted for further  evaluation of possible stroke.   PAST MEDICAL HISTORY:  1. History of migraines.  2. Diverticulosis with past admission remotely for diverticulitis.  3. Cholecystectomy in 1977.  4. 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  5. Thyroid surgery in 1994.  6. In 2003, left knee replacement.  7. Skin cancer in 2005.  8. Pneumonia in 2006.  9. Congestive heart failure with systolic dysfunction with an ejection      fraction of 40-45%.  10.Left mastectomy for breast cancer in 1995 with no positive lymph      nodes and she took tamoxifen for 1 year at that time.  11.Atherosclerotic coronary disease status post six-vessel coronary      artery bypass grafting in 2004.  12.D&C in 1960s.  13.G3, P3 parity status with normal spontaneous vaginal deliveries.  14.Hypertension.  15.History of hypoglycemia.  16.Stage III/IV chronic kidney disease, last GFR 25 mL per minute.  17.In 2007, right lower lung calcified granuloma.  18.Out of facility  DNR order.  19.Allergic rhinitis.  20.She has an elevated right hemidiaphragm, which is known.  21.In April 2009, she had a right breast mass, which was diagnosed as      breast cancer and she has since been on Arimidex therapy.  No other      therapy given and her tumor has reportedly shrunk quite a bit.  22.Severe osteoporosis status post T7 and T10 vertebral fracture.  She      did take a 2-year course of Forteo in the past.   ALLERGIES:  PENICILLIN causes rash.  SULFA causes burn in mouth.  ANESTHESIA, she is intolerant.  PRILOSEC caused headache.  FEMARA made  her sick.  THYROID REPLACEMENT causes violent headaches.  BARBITURATE,  she cannot tolerate.  She cannot take DEMEROL or CODEINE and TAMOXIFEN  made her burn all over.   MEDICATIONS:  1. Metoprolol 50 mg daily.  2. Lasix one-half of 40 mg pill daily.  3. Over-the-counter potassium 99 mg daily.  4. 325 mg aspirin daily.  5.  Os-Cal with D twice a day.  6. Claritin 10 mg daily.  7. Triavil 2/100 one pill every evening.  8. DuoNeb twice a day.  9. Budesonide 0.5 mg twice a day, but she has not been using her      nebulizers lately.  10.Vitamin D 50,000 units each week, which she has been out of for the      last couple weeks.  11.Tylenol as needed.  12.Arimidex 1 mg daily.  13.Nitroglycerin patch one-half of a 0.4 mg patch daily.  14.Fenofibrate 200 mg daily.   SOCIAL HISTORY:  She was married in 76.  Her husband, Leonette Most is still  living, but is getting weaker an older and having more difficulties.  She has 2 living children, 1 child died.  She has 3 grandchildren and 6  great-grandchildren.   FAMILY HISTORY:  Father died at age 37 of a stroke.  Mother died at age  99 of a hernia operation.   REVIEW OF SYSTEMS:  For the last 5 or 6 decades, she occasionally had  headaches with decreased vision symptoms and she still occasionally has  episodes of decreased vision, but not the headache portion.  She has had  1-2  weeks of possible drooping mouth, although she does not report much  of a problem, her family has reported this.  She is short of breath all  the time and this is not much different, although it is perhaps slightly  worse now.  She denies any new chest pains.  She has had no new  swelling.  No blood from above or below.   PHYSICAL EXAMINATION:  VITAL SIGNS:  Temperature 97, pulse 57,  respiratory rate 16, blood pressure 138/63, 97% saturation on 2 L of  oxygen, weight is 81 kg.  GENERAL:  She is in no acute distress.  Speech is fluent.  HEENT:  Normocephalic, atraumatic.  No pallor.  No icterus.  There is a  little bit dysarthria noted and she does have some right facial droop  noted.  NECK:  There is no JVD.  LUNGS:  Clear to auscultation bilaterally with no wheezes, rales, or  rhonchi.  HEART:  Regular rate and rhythm with no murmur, rub, or gallop.  ABDOMEN:  Soft, nontender, nondistended.  No edema.   LABORATORY DATA:  White count 6.7, hemoglobin 12.3, platelet count  232,000.  Sodium 142, potassium 3.7, chloride 107, CO2 of 27, BUN 32,  creatinine 1.97.  GFR 24, glucose 136, alk phos 10, albumin 3.2.  LFTs  otherwise negative.  Total cholesterol 143, HDL 35, triglycerides 100,  LDL 88, total cholesterol HDL ratio of 4.1, INR 1.0.  Cranial CT scan  shows no acute findings.  She does have atrophy and some small-vessel  disease.   ASSESSMENT/PLAN:  An 77 year old female with coronary disease, breast  cancer with right facial numbness, right facial droop, and some slight  right hand weakness.  I suspect she has had a left brain stroke in the  last 1-2  weeks.  She may also have a brain metastasis.  We will check an MRI/MRA  of the brain.  We will check echo and carotid Dopplers.  We will check a  TSH.  We will continue her home medications.  We will add Lovenox for  DVT prophylaxis.  We will resume her nebulizers for her COPD and we have  confirmed that she is a DNR code  status.      Mark A. Perini, M.D.  Electronically  Signed     MAP/MEDQ  D:  07/10/2008  T:  07/10/2008  Job:  [id number]

## 2010-05-30 NOTE — Discharge Summary (Signed)
NAME:  Veronica Bowman, Veronica Bowman                  ACCOUNT NO.:  [id number]   MEDICAL RECORD NO.:  [id number]          PATIENT TYPE:  INP   LOCATION:  3033                         FACILITY:  [facility]   PHYSICIAN:  Mark A. Perini, M.D.   DATE OF BIRTH:  03-25-1918   DATE OF ADMISSION:  07/10/2008  DATE OF DISCHARGE:  07/11/2008                               DISCHARGE SUMMARY   DISCHARGE DIAGNOSES:  1. Slurred speech.  The patient has not had any confirmed subacute or      acute stroke; however, she was found on MRI to have some old      infarcts in the left cerebellar, occipital, and posterotemporal      lobe areas.  It is possible that when she gets more fatigued that      some deficit could come out occasionally in terms of some slurred      speech.  2. Breast cancer.  3. Hypertension.  4. Chronic obstructive pulmonary disease.  5. Osteoporosis.  6. Depression.  7. Vitamin D deficiency.  8. Atherosclerotic coronary disease.  9. Hyperlipidemia.  10.Colostomy.   PROCEDURES:  MRI/MRA of the brain which showed advanced atrophy and  white matter disease.  MRA showed some small vessel changes, but no  alarming findings, and again there was a remote left-sided lacune as  well as remote left cerebellar occipital/posterotemporal lobe infarcts.   DISCHARGE MEDICATIONS:  These are unchanged and she is to take  metoprolol 50 mg daily, Lasix 1/2 of a 40 mg pill daily, aspirin 325 mg  daily, Os-Cal with D twice a day with food, Claritin 10 mg daily,  Triavil 2/10 one pill each evening, DuoNeb nebulized at least once or  twice daily, budesonide nebulized 0.5 mg once daily, rinse mouth with  water after use, vitamin D 50,000 units weekly, nitroglycerin patch 0.4  mg per day patch 1/2 patch on in the morning and off at bedtime daily.  She is to resume fenofibrate 200 mg daily with food, Arimidex 1 mg  daily, Tylenol as needed, and over-the-counter potassium 99 mg pill  daily.   HISTORY OF PRESENT ILLNESS:   Veronica Bowman is a pleasant but ageing 77 year old  female with a history of COPD, congestive heart failure, and stage IV  chronic kidney disease as well as hypertension and chronic colostomy.  She reported 1-2 weeks history of some right hand weakness and possible  drooping of the mouth on the right side.  She also had some lethargy and  some shortness of breath.  She presented to the emergency room and it  was felt that she had some right-sided findings potentially on exam.  She was admitted for further care.   HOSPITAL COURSE:  Veronica Bowman was admitted to a telemetry bed.  She had no  concerning problems on telemetry.  She remained in normal sinus rhythm.  She underwent MRI/MRA with results as noted above.  She had no  neurologic events.  She felt at her baseline, and on July 11, 2008, she  was deemed stable for discharge home.   DISCHARGE PHYSICAL EXAMINATION:  VITAL  SIGNS:  Temperature 97.0,  afebrile, pulse 57, respiratory rate 18, blood pressure 123/56, 95%  saturation on 2 L of oxygen.  GENERAL:  She is in no acute distress.  Alert and oriented x4, able to  ambulate without difficulty with no assistive devices.  LUNGS:  Clear to auscultation bilaterally with no wheezes, rales, or  rhonchi.  HEART:  Regular rate and rhythm with no murmur, rub, or gallop.  There  is no peripheral edema.   DISCHARGE LABORATORY DATA:  Lipid profile showed a total cholesterol  143, triglycerides 100, HDL 35, LDL 88, total cholesterol to HDL ratio  of 4.1, GFR was estimated at 24 mL per minute.   DISCHARGE INSTRUCTIONS:  Veronica Bowman is to follow a low-salt, heart-healthy  diet.  She is to increase her activity slowly.  She is to call if she  has any recurrent problems and she will call our office for a follow-up  visit in 4 weeks.      Mark A. Perini, M.D.  Electronically Signed     MAP/MEDQ  D:  07/11/2008  T:  07/12/2008  Job:  [id number]

## 2010-05-31 ENCOUNTER — Telehealth: Payer: Self-pay | Admitting: Cardiology

## 2010-05-31 NOTE — Telephone Encounter (Signed)
No, she does not need to sign release.  I will fax records to Dr. Donnie Aho.

## 2010-05-31 NOTE — Telephone Encounter (Signed)
Wanted [place] to send her records to Dr.Tilley.  She is changing to him from [place]. Does she need to sign a release of info?

## 2010-06-02 ENCOUNTER — Other Ambulatory Visit: Payer: Medicare Other

## 2010-06-02 NOTE — Discharge Summary (Signed)
NAME:  Veronica Bowman, Veronica Bowman                  ACCOUNT NO.:  [id number]   MEDICAL RECORD NO.:  [id number]          PATIENT TYPE:  INP   LOCATION:  4703                         FACILITY:  [facility]   PHYSICIAN:  Mark A. Perini, M.D.   DATE OF BIRTH:  [birth year]   DATE OF ADMISSION:  08/04/2004  DATE OF DISCHARGE:  08/06/2004                                 DISCHARGE SUMMARY   DISCHARGE DIAGNOSES:  1.  Multifactorial dyspnea with contributions from systolic heart      dysfunction as well as diastolic dysfunction in addition to      deconditioning, chronically elevated right hemidiaphragm, history of      underlying coronary artery disease, and possibly a component of chronic      lung disease such as chronic obstructive pulmonary disease.  2.  Severe osteoporosis.  3.  History of colovesical fistula with left lower quadrant colostomy      currently.  No definite evidence of recurrent fistula as urine test done      this admission was fairly normal.  4.  Hypertension.  5.  Gastroesophageal reflux disease.  6.  Chronic renal insufficiency with an estimated creatinine clearance of      approximately 20-25 ml/minute.  7.  Mild hypoxia.   OPERATION/PROCEDURE:  CT scan of the chest PE protocol which showed no  pulmonary emboli.  There was little change noted in her ascending thoracic  aortic aneurysm which measures 4.6 x 4.2 cm in maximum dimension and she has  stable right diaphragmatic eventration in addition and right basilar chronic  atelectasis or scarring as well as stable large bilateral renal cysts.   DISCHARGE MEDICATIONS:  1.  Alprazolam 0.25 mg one up to twice daily as needed for anxiety or      dyspnea.  2.  Toprol-XL 50 mg one daily as before.  3.  Lasix 40 mg one-half tablet daily as before.  4.  Over-the-counter potassium 99 mg daily.  5.  Aspirin 325 mg daily.  6.  Os-Cal with D one tablet three times daily with food.  7.  Vicodin one to two tablets every six hours as needed for  pain.  8.  Occasional Antivert for dizziness.  9.  Ensure one can daily.  10. Claritin 10 mg daily as needed.  11. Prevacid 30 mg once daily with food.  12. DuoNeb nebulizer twice daily every day but she may use this up to four      times a day as needed.  13. Budesonide nebulizer 0.5 mg per 2 mL, nebulize twice daily.  14. Sublingual nitroglycerin 0.4 mg which she may attempt to use up to once      daily for chest pressure sensation.  She will report back her experience      with this to [place] in the office.   HISTORY OF PRESENT ILLNESS:  Veronica Bowman is a pleasant, but very complicated  77 year old female who presented to our office as a new patient.  At that  visit, she had significant dyspnea, dyspnea on exertion which she states has  been  progressive in the two to four weeks prior to her visit with me.  Given  her marginal oxygen saturations and her symptoms and history, it was felt  that she should be admitted for further evaluation.   HOSPITAL COURSE:  Veronica Bowman was admitted to a telemetry bed.  She remained  in normal sinus rhythm with PVCs during her entire stay.  There were no  significant arrhythmias noted.  Initial EKG and cardiac enzymes were not  suggestive of an acute coronary syndrome.  Veronica Bowman underwent a CT scan with  the results as noted above.  She was placed on azithromycin for three days  as well as nebulizer treatments and oxygen.  Her breathing seemed to improve  somewhat during her stay, albeit not that much.  She was able to ambulate  and her saturations with ambulation dropped to 89% but not quite far enough  to allow for home oxygen prescription.  By July 23, she was deemed stable  for discharge home.   DISCHARGE PHYSICAL EXAMINATION:  VITAL SIGNS:  Temperature 96.8, afebrile,  pulse 78, respiratory rate 20, blood pressure 157/81, 98% saturation on 2 L  of oxygen, 94% saturation on room air, weight 174.5 pounds.  GENERAL:  She was sitting, in no acute distress.   LUNGS:  Fairly clear to auscultation bilaterally with no significant  wheezing, rales or rhonchi.  HEART:  Regular rate and rhythm with no significant murmur.  ABDOMEN:  Soft, nontender.  There was no peripheral edema.   NOTABLE LABORATORY DATA:  On day of discharge, white count 6.4, with a  normal differential.  Hemoglobin 13.1, platelet count 228,000.  Sodium 142,  potassium 4.1, chloride 107, CO2 27, BUN 24, creatinine 1.5, albumin 3.3,  calcium 8.9, phosphorus  somewhat elevated at 5.  Urinalysis showed trace  leukocytes, small blood on the dipstick with only 0-2 white cells and 0-2  red cells on the microscopic exam.  Other notable laboratory data include a  low alk phos of 29, magnesium 2.1 on admission.  INR 1 on admission.  BNP  was 126 on admission.  Urine culture was pending at the time of discharge.   DISCHARGE INSTRUCTIONS:  Veronica Bowman is to be up with her cane or her walker.  She is to follow a low-salt diet.  She is to call if there are any recurrent  problems.  She is to call our office for followup visit in eight to nine  days.  We will try to address her osteoporosis further as an outpatient.  We  are sending a 25 hydroxy vitamin D level, a TSH and an intact PTH level  prior to her being discharged today.  We will set her up for a nocturnal  desaturation pulse oximetry test to see if she is dropping her oxygen level  at night and could maybe qualify for home oxygen use.  We will set her up  with a home nebulizer machine with nebulizer treatments as noted above.  She  reports a symptom of having a pressure feeling in her chest and a flush of  warmth comer over her whole body for several minutes after  each time she eats.  This could be related to reflux although it could  possibly be related to underlying coronary disease and we are giving her a  nitroglycerin that she can try up to once a day for this symptoms report back on how this does or does not help with this  symptom.  MAP/MEDQ  D:  08/06/2004  T:  08/07/2004  Job:  [id number]   cc:   Elmore Guise., M.D.  [address]  [place], [place] [address]  Fax: [phone]   Gita Kudo, M.D.  [address], [address]  [place]  [place] [address]   Grandville Silos. Corliss Skains, M.D.  [address]  [place]  [place] [address]  Fax: [phone]

## 2010-06-02 NOTE — H&P (Signed)
NAME:  Veronica Bowman, Veronica Bowman                  ACCOUNT NO.:  [id number]   MEDICAL RECORD NO.:  [id number]          PATIENT TYPE:  EMS   LOCATION:  MAJO                         FACILITY:  [facility]   PHYSICIAN:  Mallory Shirk, MD     DATE OF BIRTH:  1918/04/18   DATE OF ADMISSION:  06/17/2004  DATE OF DISCHARGE:                                HISTORY & PHYSICAL   CHIEF COMPLAINT:  Productive cough, shortness of breath, pleuritic chest  pain x 1 day.   HISTORY OF PRESENT ILLNESS:  Veronica Bowman is a very pleasant 77 year old  Caucasian woman with the one-day history of a productive cough, shortness of  breath, chronic back pain and pleuritic chest pain.  Patient states that she  has been traveling quite a bit with her family in a car.  Several of her  family members have had the cough and cold, 3 or 4 days prior to today.  Yesterday morning, she started having the cough and shortness of breath,  then got worse and worse and she had this pain in her lower rib cage on the  anterior side which was pretty severe, so she came to the emergency room for  evaluation.  During the time of exam, patient is alert and oriented, in no  apparent distress.  Patient states that she is allergic to morphine,  codeine, Talwin and, hence, would not like any narcotic pain medication.  In  fact, only pain medication she requested for was Tylenol.  Of note, on a  prior admission in August of 2005, patient was given Dilaudid for pain  control.  However, patient has elected not to receive it at this time.  Please note that patient had an acute L3 compression fracture, status post  L3 kyphoplasty in August of 2005.  Patient also had a colostomy and has a  colostomy bag.  This was done in January of 2006.  Details of this surgery  are not available at the time of this admission.  Dr. Ginette Pitman has done  the surgery.   PAST MEDICAL HISTORY:  1.  Coronary artery disease status post CABG.  2.  GERD.  3.  Breast CA.  4.  HTN.  5.   Osteoarthritis.  6.  Colostomy, January of 2006.   MEDICATIONS ON ADMISSION:  1.  Toprol XL 50 mg p.o. daily.  2.  Lasix 20 mg every other day.  3.  Aspirin 325 mg p.o. daily.   ALLERGIES:  Patient is allergic to CODEINE, MORPHINE, NORPRAMIN, PENICILLIN,  REGLAN, SULFA, TALWIN and ZANTAC.   FAMILY HISTORY:  Noncontributory.   SOCIAL HISTORY:  Patient lives with her husband.  Denies alcohol or tobacco  or drug use.  Patient is functional, able to ambulate and do ADLs  independently.   REVIEW OF SYSTEMS:  Other than HPI, chronic back pulse.  Otherwise,  negative.  More than 10 systems reviewed.   PHYSICAL EXAMINATION:  VITAL SIGNS:  On admission, blood pressure 188/99,  repeat 165/80.  Pulse 83, respirations 24, temperature 97.8.  GENERAL:  Elderly Caucasian woman, lying in  bed, in no acute distress.  Alert and oriented x 3.  HEENT:  Normocephalic, atraumatic.  PERRL.  Sclerae anicteric.  Mucous  membranes moist.  Oropharynx not erythematous.  NECK:  Supple.  No LAD, no JVD.  LUNGS:  Bibasilar crackles.  No wheezes.  CARDIOVASCULAR:  S1 plus S2.  Regular rate and rhythm.  No murmurs, rubs or  gallops.  ABDOMEN:  Soft.  Positive bowel sounds.  Colostomy bag in place.  Midline  surgical scar, healing nicely.  EXTREMITIES:  No cyanosis, clubbing or edema.  NEUROLOGIC:  Cranial nerves 2-12 grossly intact.  Sensory, motor within  normal limits.  No focal deficits seen.   Chest x-ray:  Mild cardiomegaly.  No active disease.  CT scan negative for  PE.  Chronic ascending aortic aneurysm.  No acute abnormalities.   LABORATORY:  On admission, WBC 7.9, hemoglobin 13.5, hematocrit 40.5.  MCV  91.2.  Point of care cardiac enzymes, negative.  D-dimer 2.38.  Sodium 142,  potassium 4.2, chloride 107, bicarb of 28, glucose 114, BUN 17, creatinine  1.6.  Calcium 8.8, total protein 7.9.  Albumin 3.3, AST 26, ALT 19, alkaline  phosphatase 23, total bili 0.7.  BNP 156.3.   ASSESSMENT AND PLAN:   An 77 year old Caucasian woman with history of  coronary artery disease, presenting with shortness of breath with a  productive cough, pleuritic chest pain, no pulmonary embolus by computed  tomography.  1.  Upper respiratory infection.  We will start the patient on Avelox 400 mg      IV daily.  We will also put her on guaifenesin 200 mg p.o. q.4h. for      symptomatic relief.  Also, patient has oxygen by nasal cannula for SATs      greater than 93%.  2.  Hypertension.  Patient's blood pressure is usually controlled with      Toprol.  While she is slightly hypertensive, will continue her Toprol      and monitor for the next 24 hours before either increasing the Toprol or      adding another antihypertensive.  3.  Gastroesophageal reflux disease.  Patient is on Protonix 40 mg p.o.      daily.  4.  Deep venous thrombosis prophylaxis.  Patient has SCD ordered.  5.  Pain management.  Patient, at this time, has requested only Tylenol.      She will get Tylenol  650 q.6h. p.r.n. pain.   DISPOSITION:  After resolution of acute symptoms, patient will be evaluated  by PT/OT and then subsequently discharged home with home health as  recommended by PT.      GDK/MEDQ  D:  06/18/2004  T:  06/18/2004  Job:  [id number]   cc:   Brooke Bonito, M.D.  [address]  [place]  [place] [address]  Fax: [phone]

## 2010-06-02 NOTE — Op Note (Signed)
[place]  Patient:    Veronica Bowman, Veronica Bowman Visit Number: [id number] MRN: [id number]          Service Type: SUR Location: [id number] Attending Physician:  Carolan Shiver Ii Dictated by:   Carlisle Beers. Dorothyann Gibbs, M.D. Proc. Date: 02/12/01 Admit Date:  02/12/2001                             Operative Report  PREOPERATIVE DIAGNOSIS:  End-stage osteoarthritis, left knee.  POSTOPERATIVE DIAGNOSIS:  End-stage osteoarthritis, left knee.  OPERATION PERFORMED:  Left LCS total knee replacement.  SURGEON:  John L. Dorothyann Gibbs, M.D.  ASSISTANTJerolyn Shin. Tresa Res, M.D.  ANESTHESIA:  General.  INDICATIONS FOR PROCEDURE:  The patient had bone against bone in the left knee.  She has had significant pain for over one year and all of the temporizing measures including over-the-counter pills, Ben-Gay, heating pad have failed to help her.  She is finally here for knee replacement.  DESCRIPTION OF PROCEDURE:  Under general anesthesia, the left leg was prepared with DuraPrep and draped as a sterile field.  A sterile proximal thigh tourniquet was applied.  The leg was wrapped out with an Esmarch and the tourniquet was used at 350 mHg.  A midline incision was made.  The patella was everted.  Multiple small vessels were cauterized.  The proximal tibia was resected after first sizing the femur.  The femur was a standard size.  The proximal tibial resection was carried out using standard guide and checking it with external guide.  At this point the first femoral guide was used for intercondylar drill hole placement.  The second guide was used for the resection of the anterior and posterior flare of the distal femur and with the 12.5 mm flexion gap.  It was necessary to release tight MCL from the tibia by subperiosteal stripping to balance the compartments.  The intramedullary guide was then used for a 12.5 extension gap.  The recessing guide was then used  and following this, the lamina spreader was used and remnants of cruciates and menisci were resected as well as loose body in the posterior recess and spurs off the back of the femoral condyles.  Once this was all debrided, the tibia was exposed with the [facility] and a Hohmann.  It was a size 3.  A center peg hole with fins was applied.  Trial reduction of size 3 tibia with a 12.5 bearing and a standard femur revealed excellent fit, alignment and stability. The patella was then osteotomized.  Three peg holes placed and trial patella revealed excellent tracking.  Permanent components were then obtained.  Bony surfaces were flushed with fresh irrigation and all components were put in place.  While the cement hardened, a synovectomy of the suprapatellar pouch was carried out.  At this point the tourniquet was let down at 48 minutes. The knee was carefully tested.  Excess cement was removed.  It was determined at this point that the tibial tray plastic was no longer as stable as it was just five minutes earlier and there was a tendency towards lateral bearing spin.  The bearing was removed and trialed with a 15 bearing was significantly and acceptably stable.  At this point, a 15 bearing was inserted and the 12.5 bearing was essentially wasted.  However, stability and alignment were excellent and at this point the wound was closed in layers with #  1 surgidac, 0 and 2-0 Vicryl and skin clips.  OPERATIVE TIME:  1 hour 10 minutes.  The patient tolerated the procedure well and returned to recovery in good condition.  A medium Hemovac drain was in the knee and placing the last two stitches, care was taken to assure that it was not sutured in.  The patient was transferred to the recovery room in good condition after application of a femoral nerve block. Dictated by:   Carlisle Beers. Dorothyann Gibbs, M.D. Attending Physician:  Carolan Shiver Ii DD:  02/12/01 TD:  02/12/01 Job: [id number]

## 2010-06-02 NOTE — Consult Note (Signed)
NAME:  Veronica Bowman, Veronica Bowman                  ACCOUNT NO.:  [id number]   MEDICAL RECORD NO.:  [id number]          PATIENT TYPE:  OUT   LOCATION:  XRAY                         FACILITY:  [facility]   PHYSICIAN:  Sanjeev K. Deveshwar, M.D.DATE OF BIRTH:  [birth year]   DATE OF CONSULTATION:  DATE OF DISCHARGE:                                   CONSULTATION   HISTORY OF PRESENT ILLNESS:  This is a pleasant 77 year old female with a  history of osteoporosis.  She had a kyphoplasty at the  L3 level performed in August 2005.  She recently had a kyphoplasty performed  by Dr. Corliss Skains at the T7 and T10 levels performed June 21, 2004.  The  patient is seen back today for follow-up. Biopsies from the recent  kyphoplasty as well as the kyphoplasty from 2005 were negative for  malignancy.   The patient is accompanied by her daughter today. She is continued to have  pain, however, her pain is improved from previously.  Her daughter recalls  that her previous pain following her intervention in August 2005 took about  four weeks to resolve.   CURRENT MEDICATIONS:  The patient is intolerant to many pain medications.  She is currently taking Vicodin but causes severe nausea.  She takes Tylenol  with some relief and she is intolerant to codeine, morphine, Darvocet,  sulfa, Talwin,  Zantac, Reglan and penicillin.  She is ice packs at home for  some relief as well.   PHYSICAL EXAMINATION:  The patient's kyphoplasty site is well-healed.  She  is pain-free while at rest, however she does have pain with activity.  She  is able to walk household distances with a walker.  She is having home  health physical therapy and the therapist has encouraged her to increase her  activity.  She notes a decreased appetite felt secondary to her pain  medications.  She had a previous bone density study apparently in March, we  do not have those results, but the patient reports that she has severe  osteoporosis.  She is not currently on  any medication for this problem.   PLAN:  Dr.Deveshwar is concerned as the patient is still having a fair  amount of pain.  He considered ordering another MRI, however the patient and  her daughter would like to give this situation a little more time to see if  her pain will improve on its known.  Dr. Joetta Manners the patient to  contact us in approximately2-4 weeks to keep [place] up-to-date on her condition.   We have also recommended that the patient called Dr. Waynard Edwards for evaluation  in his [facility] and to see if the patient would be a candidate  for the medication Forteo.   IMPRESSION:  1.  Status post compression fractures with kyphoplasties performed at the T7      and T10 levels on June 21, 2004.  2.  Previous kyphoplasty at theL3 level in August 2005.  3.  History of coronary artery disease with previous coronary artery bypass      graft surgery.  4.  Recent colostomy in January 2006.  5.  Osteoarthritis.  6.  Hypertension.  7.  History of breast carcinoma.  8.  Gastroesophageal reflux disease.  9.  Chronic dizziness.  10. Intolerant to multiple medications as listed above.      Markus.Osmond   DR/MEDQ  D:  07/06/2004  T:  07/06/2004  Job:  [id number]   cc:   Mallory Shirk, MD   Dr. Daiva Huge (?)   Gita Kudo, M.D.  [address], [address]  [place]  [place] [address]   Redge Gainer. Waynard Edwards, M.D.  [address]  [place]  [place] [address]  Fax: [phone]

## 2010-06-02 NOTE — Discharge Summary (Signed)
NAME:  Veronica Bowman, Veronica Bowman                  ACCOUNT NO.:  [id number]   MEDICAL RECORD NO.:  [id number]          PATIENT TYPE:  INP   LOCATION:  5732                         FACILITY:  [facility]   PHYSICIAN:  Sanjeev K. Deveshwar, M.D.DATE OF BIRTH:  05-21-1918   DATE OF ADMISSION:  06/17/2004  DATE OF DISCHARGE:  06/23/2004                                 DISCHARGE SUMMARY   PRIMARY CARE PHYSICIAN:  Dr. Juleen China.   CONSULTANTS:  Dr. Corliss Skains.   DISCHARGE DIAGNOSES:  1.  Upper respiratory infection.  2.  Multiple vertebral fractures.  3.  Coronary artery disease status post coronary artery bypass grafting.  4.  Gastroesophageal reflux disease.  5.  Breast cancer, history of.  6.  Hypertension.  7.  Osteoporosis/osteoarthritis.   DISCHARGE MEDICATIONS:  1.  Avelox 400 mg p.o. daily (finished 10 day course).  2.  Albuterol inhaler p.r.n. one puff q.4-6h.  3.  Os-Cal D two tablets p.o. daily.  4.  Toprol-XL 50 mg p.o. daily.  5.  Lasix 10 mg p.o. every other day.  6.  Aspirin 325 mg p.o. daily.  7.  Vicodin one to two tabs p.o. q.6 p.r.n.   DIAGNOSTIC STUDIES:  1.  MR of the thoracic and lumbar spines with and without contrast.      Impression:  Acute compression deformity of inferior endplate T7 and      superior endplate T10 superimposed upon degenerative changes as      described above.  2.  Renal cystic structures incompletely evaluated on present exam,      __________ followup imagery for further delineation.  3.  Lumbar spine without contrast showed old superior inferior compression      deformity of L3 vertebrae status post kyphoplasty August 18, 2003.  She      also has moderate multifactorial L3-L4 and L4-L5 spinal stenosis with      mild bilateral foraminal narrowing as shown above.  4.  On June 18, 2004, lumbar spine x-ray shows impressive multiple new      compression fractures since prior MRI in July 2005 showing L4 vertebral      body 40-50% compression fracture at the upper  endplate of L2      approximately 30% compression, L1 body approximately 40%, T12 body      approximately 50%, T11 body approximately 50-60%, and T10 body      approximately 50%.  The L3 fracture from prior study showed it has been      stabilized after vertebroplasty.  There is superior diffuse osteopenia      noted on this x-ray finding.  5.  On June 18, 2004, CT angiogram of chest showed no evidence of acute      pulmonary embolism.  There is a stable 4.3 cm aneurysm in the thoracic      aorta.  Also noted chronic elevation of the right hemidiaphragm and      right basilar scarring.   HOSPITAL COURSE:  Please see H&P for details of admission.   Problem 1. Upper respiratory infection being treated for a 10 day  course  with Avelox with known underlying probable COPD.  She states she uses  inhalers at home.  She does not smoke and has been afebrile after initiating  antibiotic therapy.  She had a productive cough and continues to have so,  but is moving air much better at this time compared to when she was first  admitted.  At this time, she is stable on room air with good O2 saturations  not requiring any accessory muscle use and breathing without any difficulty.   Problem 2. Multiple vertebral fractures.  Dr. Corliss Skains was consulted during  this admission.  MRI was performed with the findings as indicated above.  The patient was taken to surgery for repeat vertebroplasty to the affected  areas.  Please see Dr. Fatima Sanger operative note and consult note for  details.  Forty-eight hours after intervention, the patient continues to  have back pain although this has decreased.  IR note indicates that most  likely she will have pain for approximately two weeks after this  intervention.  At this time, she is able to sit and tolerate p.o. pain  medications which is adequate for control at this time.   Problem 3. Osteoporosis with diffuse osteopenia.  She is started on calcium  and vitamin  D supplementation in this hospitalization.  Would recommend  follow up DEXA scan with possible bisphosphonate therapy.   Problem 4. Hypertension/coronary artery  disease/osteoarthritis/gastroesophageal reflux disease.  The patient had no  issues during this hospitalization.  There were no changes in her  medications and continued to take home regimen as prior to admission.   DISPOSITION:  Stable with appropriate back pain status post vertebroplasty.  The patient has been afebrile with good O2 saturations and will be released  home with followup with Dr. Corliss Skains, as well as her primary care physician  in one to two weeks.       SKD/MEDQ  D:  06/23/2004  T:  06/23/2004  Job:  [id number]   cc:   Brooke Bonito, M.D.  [address]  [place]  [place] [address]  Fax: [phone]

## 2010-06-02 NOTE — Cardiovascular Report (Signed)
NAME:  Veronica Bowman, Veronica Bowman                            ACCOUNT NO.:  [id number]   MEDICAL RECORD NO.:  [id number]                   PATIENT TYPE:  INP   LOCATION:  2314                                 FACILITY:  [facility]   PHYSICIAN:  Darlin Priestly, M.D.             DATE OF BIRTH:  Jul 24, 1918   DATE OF PROCEDURE:  05/09/2002  DATE OF DISCHARGE:                              CARDIAC CATHETERIZATION   PROCEDURES PERFORMED:  1. Left heart catheterization.  2. Coronary angiography.  3. Left ventriculogram.  4. Abdominal aortogram.   CARDIOLOGIST:  Darlin Priestly, M.D.   COMPLICATIONS:  None.   INDICATIONS FOR PROCEDURE:  This patient is an 77 year old female, patient  of Dr. Charolette Child, with a history of hypertension, hypothyroidism and  questionable history of anterior wall MI in July of 2003 after a total knee  replacement.  The patient has had stuttering chest pain for one year.  The  patient presented to the ER on the morning of May 09, 2002 with recurrent  chest pain and noted to have anterior Q waves with ST elevation.  The  patient did have initial positive enzymes.  She is now referred for a  cardiac catheterization to assess her coronary status.   DESCRIPTION OF PROCEDURE:  After obtaining informed consent the patient was  brought to the cardiac cath lab in a fasting state.  The patient's right and  left groins were then shaved, prepped and draped in the usual sterile  fashion.  ECG monitoring was established.  Using the modified Seldinger  technique a #7 French arterial sheath was inserted into the right femoral  artery.  A 6 [place] JR4 diagnostic catheter was then used to visualize the  right coronary artery.  This revealed a large RCA, which is dominant and  gives rise to the PDA as well as the posterolateral branch.  He RCA has 40%  proximal and 70% distal disease.  The PDA is a medium size vessel with 70%  ostial lesion.  The PLA is a medium size vessel, which is  diffuse irregular  up to 30% with a 60% lesion the lower bifurcation.   The right catheter was then exchanged for a JL4 guiding catheter.  This was  inserted engaging the left coronary ostium and selective angiograms were  performed.  This revealed a large left main with no significant disease.  The LAD was a medium size vessel, which coursed the apex across two diagonal  branches.  The LAD was noted to be subtotally occluded in its early proximal  portion with visible thrombus.  There was sequential 90, 80 and 80% lesions  in the mid LAD with a 90% early distal LAD stenotic lesion.  The first  diagonal was a small vessel, which is subtotally occluded in it proximal  portion.  The second diagonal is a medium size vessel with no significant  disease.  The left circumflex is a large vessel that courses the AV groove  and gives rise to two obtuse marginal branches.  AV groove circumflex is  noted to have 70% and 60% midvessel stenotic lesions.  The first OM is a  medium size vessel with a native proximal lesion.  The second OM is a large  vessel with no significant disease.   The left ventriculogram revealed a moderately depressed EF of 30-35% with  anterior and apical hypokinesis.   Abdominal aortogram revealed 80% left renal artery stenosis and 50% right  renal artery stenosis.  There was noted to be distal aortic tapering with  mild-to-moderate infrarenal disease.   HEMODYNAMIC DATA:  Systemic arterial pressure 170/81.  LV systemic pressure  170/19.  LVEDP of 35.   CONCLUSION:  1. Significant three-vessel coronary artery disease.  2. Moderate depressed left ventricular systolic function with wall motion     abnormalities as noted above.  3. Eighty percent left renal artery stenosis and 50% right renal artery     stenosis.  4. Systemic hypertension.  5. Elevated left ventricular end diastolic pressure.                                                 Darlin Priestly,  M.D.    RHM/MEDQ  D:  05/09/2002  T:  05/11/2002  Job:  [id number]   cc:   Aram Candela. Aleen Campi, M.D.  [address]  [place]  [place] [address]  Fax: [phone]

## 2010-06-02 NOTE — Discharge Summary (Signed)
NAME:  Veronica Bowman, Veronica Bowman                            ACCOUNT NO.:  [id number]   MEDICAL RECORD NO.:  [id number]                   PATIENT TYPE:  INP   LOCATION:  5012                                 FACILITY:  [facility]   PHYSICIAN:  Mobolaji B. Corky Downs, M.D.            DATE OF BIRTH:  04-27-1918   DATE OF ADMISSION:  08/15/2003  DATE OF DISCHARGE:  08/19/2003                                 DISCHARGE SUMMARY   DISCHARGE DIAGNOSES:  1. Acute back pain L3 compression fracture, status post L3 kyphoplasty.  2. Hypertension.  3. Osteoarthritis.  4. Status post breast cancer.  5. Gastroesophageal reflux disease.   HISTORY OF PRESENT ILLNESS:  The patient is an 77 year old white female, who  was admitted with back pain which has been progressively getting worse for  three weeks.  MRI done as an outpatient revealed compression fracture of her  back which did not improve with Oxycodone and the patient was subsequently  admitted for pain management and kyphoplasty by [place] Silos. Corliss Skains, M.D.   REVIEW OF SYSTEMS:  Negative for fever, chills, night sweats, weight loss,  diarrhea, no constipation, no vomiting.  No chest pain or shortness of  breath.  No urinary symptoms.   PAST MEDICAL HISTORY:  1. CAD, status post CABG.  2. GERD.  3. Breast CA.  4. Hypertension.  5. Osteoarthritis.   PAST SURGICAL HISTORY:  CABG in April of 2004.  Left knee replacement,  bilateral knee arthroplasty, cholecystectomy, thyroid surgery in 1940's.  Modified left radical mastectomy.   FAMILY HISTORY:  She lives with her husband, otherwise noncontributory.   SOCIAL HISTORY:  She does not smoke cigarettes nor drink alcohol.  No use of  illicit drugs.   MEDICATIONS:  1. Atenolol 50 mg p.o. daily.  2. Lasix 20 mg p.o. daily.  3. Folic acid one p.o. daily.  4. Potassium chloride.  5. Triavil 20 mg daily.  These are as per patient's list.  6. Aspirin 325 mg p.o. daily.   ALLERGIES:  SULFA, VIOXX, PENICILLIN,  DEMEROL, REGLAN, COZAAR, ZANTAC,  CODEINE, MORPHINE, MOTRIN.   PHYSICAL EXAMINATION:  GENERAL:  She was alert and oriented, in mild  distress.  VITAL SIGNS:  Temperature 97.4, heart rate 76, respiratory rate 19, blood  pressure 156/56, oxygen saturation 95% on room air.  HEENT:  Normocephalic and atraumatic.  Pupils equal, round, and reactive to  light and accommodation.  Extraocular movements intact bilaterally.  NECK:  Supple, no thyromegaly, no elevated JVD.  LUNGS:  Clear clinically to auscultation. No wheeze, no rales, and no  rhonchi.  HEART:  S1 and S2 regular, no gallop, and no rub.  ABDOMEN:  Nondistended, soft, no hepatosplenomegaly.  Normal bowel sounds.  EXTREMITIES:  No cyanosis, clubbing, or edema.  NEUROLOGY:  The patient could move both lower extremities.   HOSPITAL COURSE:  She was started on Dilaudid IV to relieve pain and in  addition ibuprofen.  She was given GI prophylaxis with Protonix.  The  patient was given gentle IV fluid hydration.  Blood pressure was controlled  with Atenolol 50 mg daily.  The patient underwent L3 kyphoplasty.  She did  well postoperatively.  She was observed for 24 hours.  The patient was  discharged home in stable condition with improvement in low back pain.  She  was discharged home on all of her previous medications, in addition  hydrocodone, acetaminophen one p.o. q.4h, Colace 100 mg p.o. b.i.d., and she  was instructed to stop the Colace at the same time when she does not need to  use hydrocodone and instructed to decrease her pain medications as tolerated  over the next one to two weeks.  Instructed not to undergo any heavy  lifting, strain of exercise for now and avoid bending over as much as  possible.  She was instructed to call _____________ if fever, pain, or  redness occur in the next three to five days.  The patient was instructed to  follow up with Brooke Bonito, M.D. in one to two weeks for appointment.                                                 Mobolaji B. Corky Downs, M.D.    MBB/MEDQ  D:  08/23/2003  T:  08/24/2003  Job:  [id number]   cc:   Brooke Bonito, M.D.  [address]  [place]  [place] [address]  Fax: [phone]

## 2010-06-02 NOTE — Consult Note (Signed)
NAME:  Veronica Bowman, Veronica Bowman                  ACCOUNT NO.:  [id number]   MEDICAL RECORD NO.:  [id number]          PATIENT TYPE:  EMS   LOCATION:  MAJO                         FACILITY:  [facility]   PHYSICIAN:  Elmore Guise., M.D.DATE OF BIRTH:  12/16/1918   DATE OF CONSULTATION:  04/12/2005  DATE OF DISCHARGE:  04/12/2005                                   CONSULTATION   INDICATION FOR CONSULT:  Palpitations, chest pain, hypertension.   HISTORY OF PRESENT ILLNESS:  The patient is a very pleasant 77 year old  white female past medical history of coronary artery disease (status post  coronary artery bypass grafting 2004), mild LV dysfunction (EF 40-45%),  hypertension, osteoporosis, GERD, chronic renal insufficiency with baseline  creatinine 1.5-1.6 range, history of colovesical fistula with left lower  quadrant colostomy who presented to the ER with palpitations, chest  fluttering, dyspnea and elevated blood pressure.  The patient reports  normal state of health; however, the last week she has been having off and  on sharp substernal chest pain.  This would come and go.  She took a  nitroglycerin and it would be relieved.  Over the last month, she has gone  to see Dr. Waynard Edwards, her primary care physician, and was started on lisinopril  because her blood pressure remained elevated.  She reports compliance with  her medications and actually she initially was feeling better after starting  this new medicines.  Today after she awoke, she was doing her normal  activities, started feeling her heart fluttering.  She denies any  presyncope or syncope.  No significant shortness of breath.  She had an  episode of chest discomfort which she described as a sharp stabbing-like  pain in her anterior chest region.  No significant radiation.  She took a  nitroglycerin with some improvement.  Her symptoms tended to linger on.  She  then presented to the [facility] Emergency Department for further  evaluation.  On  arrival, the patient was noted to have significantly  elevated blood pressure in the 190/100 range.  She was treated accordingly  and a cardiology consult was obtained.  She has chronic urinary problems as  well as off and on lower abdominal cramping.  She had an episode of GI  illness back in February which has resolved since that time.  She thought  this was secondary to getting some bad peanut butter.  She denies any  significant lower extremity edema.  No recent fever or flu-like symptoms.   REVIEW OF SYSTEMS:  As per HPI.  All others negative.   CURRENT MEDICATIONS:  1.  Lasix 20 mg daily.  2.  Aspirin once daily.  3.  Potassium 10 mEq daily.  4.  Toprol XL 50 mg daily.  5.  Calcium 1 tablet b.i.d.  6.  Vicodin 1 tablet b.i.d.  7.  Lisinopril 10 mg daily.   ALLERGIES:  MORPHINE, REGLAN, CODEINE, TALWIN, ZANTAC, SULFA, PENICILLIN,  DOLOBID, DEMEROL, COZAAR, MINOCYCLINE, SULAR, VIOXX and CIPRO.   FAMILY HISTORY:  Positive for cancer, diabetes and cirrhosis.   SOCIAL HISTORY:  She is a retired housewife and currently married.  She had  a 50-pack year history of tobacco, quit approximately 3 years ago.  No  alcohol.   PAST SURGICAL HISTORY:  Six-vessel CABG in 2004, left breast mastectomy,  recent colectomy and cholecystectomy.  She has also had left knee  replacement, thyroidectomy and lower back fracture status post cement  placement.   EXAM:  Blood pressure initially 190/100, heart rate in the 60-70 range.  Sat  98% on room air.  In general, she is a very pleasant elderly white female,  alert and oriented x4 in no acute distress.  HEENT appears normal.  Neck is  supple.  No lymphadenopathy, 2+ carotids.  No JVD, no bruits.  Lungs were  clear.  Heart is regular with a normal S1-S2.  Soft S4 noted.  Abdomen is  obese, soft, nontender, nondistended.  Colostomy bag noted in the left lower  quadrant.  Extremities are warm with 2+ pulses and no significant edema.   Neurologically, cranial nerves are intact.  Strength is equal bilaterally.  Chest x-ray shows no acute cardiopulmonary disease.  Her cardiac enzymes  were negative.  Her UA is negative.  Her creatinine is 1.5 and her  hemoglobin is stable.   The patient is currently resting comfortably without any symptoms and with  negative blood work and improvement in her blood pressure since arrival.  She is deemed safe for discharge.   IMPRESSION:  1.  Palpitations.  2.  Uncontrolled hypertension.  3.  History of coronary artery disease.  4.  History of left ventricular dysfunction   PLAN:  At this time, I do feel the patient is safe for discharge.  I would  like to change her medicines to the following.  I would like to stop her  Toprol and start Coreg 10 mg once daily and also start Imdur 30 mg once  daily.  We will have the patient follow up with [place] early next week for  further evaluation.  If her palpitations or chest discomfort continues, we  will evaluate with repeat stress test.  Her last stress evaluation was back  in 2005 which showed no significant inducible ischemia.  She is to notify  the office should she have any further problems tonight.  Otherwise I will  see her next week.      Elmore Guise., M.D.  Electronically Signed     TWK/MEDQ  D:  04/12/2005  T:  04/13/2005  Job:  [id number]

## 2010-06-02 NOTE — Discharge Summary (Signed)
[facility]  Patient:    Veronica Bowman, Veronica Bowman Visit Number: [id number] MRN: [id number]          Service Type: [facility] Location: [id number] Attending Physician:  Herold Harms Dictated by:   Junie Bame, P.A. Admit Date:  02/18/2001 Discharge Date: 02/21/2001   CC:         Reuel Boom L. Thomasena Edis, M.D.  John L. Dorothyann Gibbs, M.D.  Bernadene Person, M.D.   Discharge Summary  DISCHARGE DIAGNOSES: 1. Left total knee replacement. 2. Hypokalemia. 3. History of thyroid disease. 4. History of hiatal hernia.  HISTORY OF PRESENT ILLNESS:  The patient is an 77 year old white female with past medical history of hypertension, thyroid disease admitted on 02/12/01 for left total knee replacement secondary to osteoarthritis by Dr. Priscille Kluver.  The patient was on arixtra for deep venous thrombosis prophylaxis. Postoperative complications included anemia, an elevated temperature, bradycardia and migraines.  The physical therapy report at this time indicates the patient is ambulating with full supervision 20 feet with standard walker, can transfer sit to stand with close supervision.  The patient was transferred to the rehabilitation department.  PAST MEDICAL HISTORY: Significant for hypertension, thyroid disease, hiatal hernia and migraines.  PAST SURGICAL HISTORY: Significant for gallbladder, breast surgery and thyroid surgery.  PRIMARY CARE PHYSICIAN:  Bernadene Person, M.D.  SOCIAL HISTORY:  The patient lives in a one story home [place]  She has three steps to entry.  She needed assist for ambulating prior to admission. She denies any alcohol or tobacco use.  She has a supportive family and daughter.  ALLERGIES:  CODEINE, SULFA, REGLAN, MORPHINE.  REVIEW OF SYSTEMS: Significant for joint pain and joint swelling, reflux and migraines.  FAMILY HISTORY:  Noncontributory.  HOSPITAL COURSE:  The patient was admitted to the  [facility] rehabilitation department on 02/18/01 for comprehensive patient rehabilitation which she received with more than three hours of physical therapy and occupational therapy daily.  The patient made fair progress while she was in rehabilitation.  There were no significant major medical complications during her three day stay.  She received Trinsicon p.o. b.i.d. for mild anemia.  She remained on arixtra throughout her entire stay for deep venous thrombosis prophylaxis.  Venous Dopplers were performed on 02/19/01 which demonstrated negative for deep venous thrombosis, SVT and Bakers cyst.  Aside from occasional indigestion there were no other medical issues that occurred.  The patient received Maalox as needed for indigestion.  LATEST LABORATORY DATA: CBC:  Hemoglobin 11.5, hematocrit 33.1, white blood cell count 8.5, platelet count 265K.  ELECTROLYTES:  Sodium 140, potassium 4.4, chloride 108, cO2 26, glucose 101, BUN 12, creatinine 1.2.  AST 25, ALT was 23.  URINE CULTURE:  Patient had a urine culture performed on 02/18/01 which demonstrated multiple species greater than 100K colonies.  PHYSICAL EXAMINATION::  At the time of discharge the her incision demonstrated moderate erythema around the incision with 2+ edema.  Staples were still intact.  VITAL SIGNS:  At the time of discharge the blood pressure was 140/60, respirations 20, pulse 64, temperature 98.7.  Physical therapy report indicated the patient could ambulate modified independent with rolling walker, 50 feet times two.  Patient could transfer sit to stand modified independence.  She had bed mobility modified independently.  The patient could perform all activities of daily living with supervision modified independently level.  The patient had approximately 85% degrees flexion in her left knee.  DISPOSITION:  Patient was discharged home with her family.  DISCHARGE MEDICATIONS: 1. Triavil, resume home  dose. 2. Atenolol 50 mg daily. 3. Prevacid 30 mg daily. 4. K-Dur 10 mEq daily. 5. Percocet 5/325 one to two tablets q.4-6h. as needed for pain. 6. Tylenol as needed for pain. 7. Aspirin 325 mg daily.  ACTIVITY:  Patient is to use her walker, no driving, no drinking.  DIET:  Low salt.  WOUND CARE:  Cherlynn Polo are to be removed by Dr. Oretha Ellis office next Tuesday. FOLLOW UP:  She will have [facility] for physical therapy and occupational therapy.  She is to follow up with Dr. Juleen China within four to six weeks.  She is to follow up with Dr. Ellwood Dense as needed.  Follow up with Dr. Priscille Kluver on Monday or Tuesday of next week for staple removal. Dictated by:   Junie Bame, P.A. Attending Physician:  Herold Harms DD:  03/23/01 TD:  03/25/01 Job: [id number]

## 2010-06-02 NOTE — Discharge Summary (Signed)
NAME:  Veronica Bowman, Veronica Bowman                            ACCOUNT NO.:  [id number]   MEDICAL RECORD NO.:  [id number]                   PATIENT TYPE:  INP   LOCATION:  2002                                 FACILITY:  [facility]   PHYSICIAN:  Gwenith Daily. Tyrone Sage, M.D.            DATE OF BIRTH:  Jan 27, 1918   DATE OF ADMISSION:  05/09/2002  DATE OF DISCHARGE:  05/20/2002                                 DISCHARGE SUMMARY   ADMITTING DIAGNOSIS:  Acute myocardial infarction.   PAST MEDICAL HISTORY:  1. Coronary artery disease, status post MI, January 2003, following left     total knee replacement.  Treated medically.  Followed by Dr. Aram Candela.     Tysinger; underwent outpatient stress test, July 2003, with results     within normal limits.  2. Hypertension.  3. GE reflux disease.  4. Lower esophageal stricture, dilatation five years ago by Dr. Georgiana Spinner.  5. Breast cancer, status post left modified radical mastectomy in 1997, also     treated with tamoxifen.   PAST SURGICAL HISTORY:  Other surgical history includes:  1. Cholecystectomy in 1997 complicated by dehiscence of her abdominal wound     with evisceration, requiring prolonged hospitalization for recovery.  2. Thyroid surgery in the 1940s for hypothyroidism.  3. Left total knee replacement, January 2003.  4. Bilateral knee arthroplasties in the past.   ALLERGIES:  The patient reports a very long list of drug allergies or  sensitivities:  VERTABS, MORPHINE, NORPRAMIN, REGLAN, CODEINE, TALWIN,  DINAZYME, ZANTAC, IGT, SULFA, PENICILLIN, DOLOBID, __________, MEPERGAN,  DEMEROL, COZAAR, VIOXX, MINOCIN, SULAR.   DISCHARGE DIAGNOSIS:  Two-vessel coronary artery disease with acute  myocardial infarction, status post coronary artery bypass graft.   BRIEF HISTORY:  The patient is an 77 year old female who has been  experiencing stuttering chest pain x1 year.  The morning of May 09, 2002,  she awoke with severe substernal chest burning.  This  was not relieved with  Mylanta, which usually helps her chest pain.  EMS was called and she was  transferred to [facility] Emergency Department.  On arrival, her EKG  revealed anterior Q waves and ST elevations.  Cardiac enzymes were mildly  positive with CK 93, troponin 0.41.  She was evaluated in the emergency  department by Dr. Darlin Priestly, who recommended proceeding with urgent  cardiac catheterization.   HOSPITAL COURSE:  May 09, 2002, she underwent urgent cardiac  catheterization by Dr. Jenne Campus; this revealed:  #1 - Significant three-  vessel coronary artery disease.  #2 - Moderate decreased LV function with  ejection fraction estimated to be 30-35% and apical hypokinesis, #3 - renal  artery stenosis of 80% on the left and 50% on the right.  As her lesions  were not amenable to PCI, cardiac surgery consult was obtained.  The patient  remained stable post catheterization.  She was evaluated later in the day by  Dr. Purcell Nails.  After examination of the patient and review of the  available records including catheterization films, Dr. Cornelius Moras felt that the  patient would benefit from coronary revascularization, however, she would be  at increased risk for complications due to her advanced age, LV dysfunction,  recent AMI, diffuse disease involving the LAD coronary artery as well as her  comorbid conditions.  The procedure, risks and benefits were discussed with  the patient and her family and they agreed to proceed with surgery.  Arrangements were made for surgery to be performed by Dr. Ramon Dredge B.  Gerhardt.  Later in the day on the 24th, she underwent Doppler studies which  revealed no significant carotid artery disease, and her ABIs were noted to  be greater than 1.0, bilaterally.   May 11, 2002, the patient underwent the following surgical procedure by  Dr. Sheliah Plane:  Coronary artery bypass grafting x6.  Grafts placed at  time of procedure:  Left internal mammary  artery grafted in a sequential  fashion to the second diagonal and distal LAD, saphenous vein was grafted in  a sequential fashion to the first obtuse marginal and distal circumflex  artery, saphenous vein was grafted in a sequential fashion to the posterior  descending and posterolateral branches of the right coronary artery.  Veins  were harvested for the bypass grafts from the bilateral lower extremities  with a surgical technique.  The patient tolerated this surgical procedure  reasonably well and was transferred in stable condition to the SICU.  She  remained hemodynamically stable in the immediate postoperative period and  she was extubated by early the next morning.   Postoperative issues include:  #1 -  LEFT VENTRICULAR DYSFUNCTION:  Beta blocker was changed to Coreg when  she started taking p.o.  She has tolerated this medication very well.   #2 - POSTOPERATIVE ANEMIA:  On May 14, 2002, that is postoperative day 3,  her hemoglobin of 7.7 and hematocrit 22.7 were treated with transfusion of  one unit of packed red blood cells.  By the morning of May 15, 2002, her  hemoglobin was 9.3 and her hematocrit 26.8.   #3 - MOBILITY:  She was seen in consultation by cardiac rehab phase 1, as  well as physical therapy.  She was also evaluated for a possible stay in the  subacute care unit.  The patient had generalized weakness and easy  fatigability in the first several days following her surgery.  She made slow  steady progress and is now ambulating well with the assistance of a rolling  walker.  She was reevaluated by [facility] on the 3rd and they determined that her  ambulation was too far advanced to be suitable for a SACU placement.  Home  health services will be arranged at discharge for assistance.   #4 - RESPIRATORY:  The patient has a long history of tobacco use (a 60-year history).  She required supplemental oxygen to maintain her saturations  greater than 90%.  She participated  with aggressive pulmonary toilet with  incentive spirometer as well as a flutter valve and frequent ambulation and  she has made very good progress.  She is off oxygen the morning of May 19, 2002.  Room air saturations after a walk that morning were 91%.   #5 - VOLUME OVERLOAD:  Her weight has been over her preoperative weight  since her surgery and she has also  been experiencing significant lower  extremity edema.  She is now responding well to her diuretics and she is  close to her preoperative weight.   May 19, 2002, postoperative day 8, her vital signs are stable with blood  pressure 140/70.  She is afebrile.  Her heart has maintained normal sinus  rhythm at a rate of approximately 65.  Her lungs are essentially clear.  She  is moving air much better today.  Her bowel or bladder function is within  normal limits for her.  She is tolerating 50-75% of her meals.  Her chest  incision is healing well.  Her bilateral lower extremity incisions are  intact.  There does continue to be some mild serous drainage at the ankles.  Her ambulation is improving.  She walked with cardiac rehab 340 feet with  minimal assistance and her rolling walker.  If the patient continues to make  progress in this manner, it is expected she will be ready for discharge home  tomorrow, May 20, 2002.   RECENT LABORATORY STUDIES:  May 19, 2002 CBC revealed white blood cells 9.9,  hemoglobin 10.4, hematocrit 30.0, platelets 348,000.  Chemistries include a  sodium of 136, potassium 4.7, BUN 15, creatinine 1.4, glucose 97, calcium  8.6.   CONDITION ON DISCHARGE:  Improved.   INSTRUCTIONS ON DISCHARGE:   MEDICATIONS:  1. Coreg 6.25 mg p.o. b.i.d. -- suggested at 8 a.m. and 8 p.m.  2. Digoxin 0.125 mg p.o. daily.  3. Aspirin 325 mg p.o. daily.  4. Folic acid 1 mg p.o. daily.  5. Colace 200 mg p.o. daily.  6. Combivent inhaler two puffs q.i.d.   She is also instructed to resume the following home medications:  1.  Lasix 40 mg daily and potassium chloride 20 mEq p.o. daily; these are     both new doses for her.  2. Zantac 75 mg p.o. daily.  3. For pain, she may have Ultram 150 mg one to two p.o. q.4-6h. for moderate-     to-severe pain or Tylenol 325 mg p.o. q.4-6h. p.r.n. for mild pain.   ACTIVITY:  She has been asked to refrain from any driving or any heavy  lifting, pushing or pulling.  She is also instructed to continue her  breathing exercises and daily walking.   DIET:  Her diet should remain a low-salt diet.   WOUND CARE:  She may shower with mild soap and water.  Home health services  will be arranged to check on progress of her incisions as well as  arrangements for skin staple removal.  Every other staple will be removed on  May 27, 2002, with the remaining staples out on Jun 03, 2002.   FOLLOWUP:  1. Dr. Aram Candela. Tysinger is to see her in his office in approximately two    weeks and she has been asked to call to     arrange that appointment.  2. Dr. Tyrone Sage would like to see her at the [facility] office on Thursday, Jun 04, 2002, at 1:20.  She will be asked to have a chest x-ray at [facility] at     12:20 that day.     Toribio Harbour, R.N.                  Gwenith Daily. Tyrone Sage, M.D.    CTK/MEDQ  D:  05/19/2002  T:  05/21/2002  Job:  [id number]   cc:   Aram Candela. Aleen Campi, M.D.  [address]  [place]  [place] [address]  Fax: [phone]   Brooke Bonito, M.D.  [address]  [place]  [place] [address]  Fax: [phone]

## 2010-06-02 NOTE — Discharge Summary (Signed)
[facility]  Patient:    Veronica Bowman, Veronica Bowman Visit Number: [id number] MRN: [id number]          Service Type: [facility] Location: [id number] Attending Physician:  Herold Harms Dictated by:   Jamelle Rushing, P.A.-C Admit Date:  02/18/2001 Discharge Date: 02/21/2001   CC:         Bernadene Person, M.D.   Discharge Summary  ADMISSION DIAGNOSES: 1. Severe osteoarthritis of bilateral knees, left greater than right. 2. Hypertension. 3. History of thyroid disease. 4. History of hiatal hernia. 5. History of migraines. 6. History of hyperglycemia. 7. Esophageal strictures. 8. Obesity.  DISCHARGE DIAGNOSES: 1. Left total knee arthroplasty. 2. Postoperative blood loss anemia. 3. Hypertension. 4. Significant bradycardia. 5. History of hiatal hernia. 6. Obesity. 7. History of hypothyroidism. 8. History of tobacco use. 9. History of hyperglycemia.  HISTORY OF PRESENT ILLNESS:  The patient is an 77 year old white female with many years of left knee pain which has progressively worsened with time and the amount of weightbearing activity.  The pain is described as a constant, throbbing, aching sensation with sharp shooting pains with awkward movements. The patient has numbness in the left leg from the foot to the thigh.  She does have popping, grinding, giving out, and catching sensations.  X-ray revealed bone-on-bone medial compartment with significant sclerosis and spurring.  ALLERGIES:  BIAXIN, ERYTHROMYCIN, DOXYCYCLINE, DEMEROL, MORPHINE, NAPROSYN, REGLAN, CODEINE, TALWIN, SULFA, PENICILLIN, ALLEGRA, VIOXX, MIACALCIN, AND SEVERAL OTHERS UNABLE TO READ FROM PATIENTS PREVIOUS CHARTING.  CURRENT MEDICATIONS: 1. Atenolol 50 mg p.o. q.d. 2. Triavil 2/10 mg p.o. q.d. 3. Prevacid 30 mg p.o. q.d. 4. Lasix 20 mg p.o. p.r.n. 5. K-Dur 10 mEq p.o. p.r.n. 6. Claritin p.r.n. 7. Darvocet p.r.n.  SURGICAL PROCEDURE:  On February 12, 2001, the patient was taken  to the OR by Dr. Jonny Ruiz L. Rendall, assisted by Dr. Vear Clock.  The patient underwent a left LCS total knee replacement.  The patient tolerated the procedure well. Operative time was 1 hour and 10 minutes.  One Hemovac drain was left in place.  The patient had a femoral nerve block prior to leaving the operative suite.  CONSULTATIONS: 1. Routine physical therapy, occupational therapy, rehab, case management    consults were requested. 2. Cardiology consult was requested from the PACU due to severe bradycardia.  HOSPITAL COURSE:  The patient was admitted to [facility] on February 12, 2001, under the care of Dr. Jonny Ruiz L. Rendall.  The patient was taken to the operating room where a left total knee arthroplasty was performed.  The patient tolerated the procedure well, and was transferred to the recovery room in good condition.  While in the recovery room, the patient had significant syncopal episodes with severe sinus bradycardia in the 56 beats per minute range.  A cardiology consult was requested, and the patient was evaluated and treated with Atropine.  The patient was placed on the telemetry floor for cardiac monitoring and continued orthopedic recuperation.  From the cardiac standpoint, the patient had her bradycardia resolve with no recurrent episodes.  It was felt from the cardiac evaluations that it was a vagal-vagal response, and she was cleared for continued rehab.  The patient, if she had any further problems, would be further evaluated.  The patient then incurred a total of 5 days postoperative care, in which she progressed slowly with physical therapy.  She did develop some postoperative blood loss anemia with her hemoglobin and hematocrit dropping  to 8.8, and she was typed and crossed, and transfused 2 units of blood without any complications.  Her wound remained benign for any signs of infection.  Her leg remained neuromotorvascularly intact, and she was able to be  transferred to the rehabilitation floor on postoperative day #5, both stable from the orthopedic and cardiac standpoint.  LABORATORY DATA:  EKG in the recovery room was sinus bradycardia at 56 beats per minute, otherwise normal.  EKG on February 12, 2001, at 7:57 p.m. was normal sinus rhythm with frequent premature ventricular complexes, minor voltage for left ventricular hypertrophy at 86 beats per minute.  Hemoglobin and hematocrit on February 17, 2001, hemoglobin 10.7, hematocrit 30.8.  Routine chemistries on February 14, 2001, sodium of 138, potassium 4.4, glucose 131, BUN 13, creatinine 1.2.  Cardiac markers on February 12, 2001, CK 104, CK-MB 0.9, relative index 0.9, troponin-I 0.01.  Routine urinalysis on admission was normal.  The patient received a total of 2 units of packed red blood cells during hospitalization.  DISCHARGE MEDICATIONS:  1. Colace 100 mg p.o. b.i.d.  2. Senokot one tablet p.o. b.i.d. a.c.  3. Tenormin 50 mg p.o. q.d.  4. Protonix 40 mg p.o. q.d.  5. Erixstra 2.5 mg subcutaneously q.24h. x10 days.  6. Elavil 10 mg p.o. q.h.s.  7. Trilafon 2 mg p.o. q.h.s.  8. Percocet one or two tablets q.4-6h. p.r.n. pain.  9. Lasix 20 mg p.o. p.r.n. 10. Gaviscon foam tab one or two tablets p.r.n.  DISCHARGE INSTRUCTIONS TO ORTHOPEDIC REHABILITATION FLOOR: 1. Medications:  The patient is to continue medications as dispensed. 2. Activity:  The patient may be weightbearing as tolerated with close    supervision and walker. 3. Diet: No restrictions. 4. Wound care:  The patient is to have wound checked daily for any signs of    infection, staples to be removed on postoperative day #14.  FOLLOWUP:  The patient is to have a follow-up appointment with Dr. Priscille Kluver two weeks from date of discharge from rehabilitation floor.  CONDITION ON DISCHARGE TO REHABILITATION UNIT:  Improved and good. Dictated by:   Jamelle Rushing, P.A.-C  Attending Physician:  Herold Harms DD:  03/20/01 TD:  03/22/01 Job: [id number]

## 2010-06-02 NOTE — Op Note (Signed)
NAME:  Veronica Bowman, Veronica Bowman                            ACCOUNT NO.:  [id number]   MEDICAL RECORD NO.:  [id number]                   PATIENT TYPE:  INP   LOCATION:  2314                                 FACILITY:  [facility]   PHYSICIAN:  Gwenith Daily. Tyrone Sage, M.D.            DATE OF BIRTH:  August 04, 1918   DATE OF PROCEDURE:  05/11/2002  DATE OF DISCHARGE:                                 OPERATIVE REPORT   PREOPERATIVE DIAGNOSIS:  Recent acute myocardial infarction with three-  vessel coronary occlusive disease.   POSTOPERATIVE DIAGNOSIS:  Recent acute myocardial infarction with three-  vessel coronary occlusive disease.   SURGICAL PROCEDURE:  Coronary artery bypass grafting x6 with the left  internal mammary sequentially to the second diagonal and distal LAD.  Sequential reversed saphenous vein graft to the first obtuse marginal and  distal circumflex.  Sequential reversed saphenous vein graft to the  posterior descending and posterolateral branches of the right coronary  artery, placement of right femoral arterial line.   SURGEON:  Gwenith Daily. Tyrone Sage, M.D.   FIRST ASSISTANT:  Loura Pardon, P.A.   BRIEF HISTORY:  The patient is an 77 year old female who over the past year  has had numerous admissions to the emergency room with unstable anginal  symptoms, both at [facility] and in [place].  On April 24, she had  an episode of prolonged chest pain with shortness of breath and came to the  emergency room.  CK-MBs were markedly elevated with a CK-MB of over 80.  She  was taken urgently to the cath lab by Dr. Jenne Campus.  Catheterization revealed  significant anteroapical hypokinesis, diffuse disease throughout the LAD  system, sequential greater than 80-90% lesions also involving the diagonal  coronary artery.  The distal vessel was small.  She had a large obtuse  marginal and distal circumflex, both with 80% lesions.  The right coronary  artery had diffuse disease, including disease in the  posterior descending.  Because the patient stabilized medically on Integrilin, coronary artery  bypass grafting was recommended.  The patient has been a smoker for almost  60 years and has known single artery stenosis.  In spite of the increased  risk of surgery because of her age, smoking history, and acute myocardial  infarction, she was agreeable with proceeding with surgery as the best  treatment option.   DESCRIPTION OF PROCEDURE:  With Swan-Ganz and arterial line monitors placed,  the patient underwent general endotracheal anesthesia without incident.  The  skin of the chest and legs were prepped with Betadine and draped in the  usual sterile manner.  Initially, we attempted to use Endovein; however, a  satisfactory vein at the knee could not be located.  The patient had a  satisfactory vein in both lower extremities below the knee.  One segment of  vein was harvested from each lower leg.  After one segment, the veins became  small and not useable.  A median sternotomy was performed.  The left  internal mammary artery was dissected down as a pedicle graft.  The distal  artery was divided and had good free flow.  The pericardium was open.  The  overall ventricular function appeared depressed with significant  anteroapical hypokinesis.  The patient was systemically heparinized.  The  ascending aorta and the right atrium were cannulated in the aortic root.  Then, a cardioplegia needle was introduced into the ascending aorta.  The  patient was placed in cardiopulmonary bypass, 2.4 liters per minute per  meter squared.  The sites of anastomosis were selected and dissected  __________ epicardium.  The patient's body temperature was cooled to 30  degrees.  The aortic cross clamp was applied, and 500 mL of cold blood  potassium cardioplegia was administered with rapid diastolic arrest of the  heart.  The myocardial septal temperature was monitored throughout the cross  clamp.  Attention was  turned first to the OM-1 vessel.  Using a diamond  type, side-to-side anastomosis was carried out with a segment of reversed  vein graft.  The distal extent of the same vein was then carried to the  distal circumflex which was slightly smaller but admitted a 1.5 mm probe.  Using a running 7-0 Prolene, a distal anastomosis was performed.  Attention  was then turned to the posterior descending coronary artery which was open  and also diffusely diseased.  Using a diamond type, side-to-side anastomosis  was carried out.  The distal extent of the same vein was carried to the  posterolateral branch beside the right coronary artery.  A distal  anastomosis was performed with a running 7-0 Prolene.  Attention was then  turned to the distal LAD, which was open and admitted a 1 mm probe  proximally and distally.  The second diagonal coronary artery, which  appeared to be larger than the distal LAD proper was also identified and  open.  Using a sequential, left internal mammary artery was done to the  diagonal with a running 8-0 Prolene.  The distal extent of the mammary was  then carried to the distal LAD, and a running 8-0 Prolene was used for the  distal anastomosis.  With the release of the Edwards bulldog on the mammary  artery, there was appropriate rise in myocardial septal temperature.  The  aortic cross clamp was removed with a total cross clamp time of 98 minutes.  A partial occlusion clamp was placed on the ascending aorta.  Two punch  aortotomies were performed, each with two vein grafts anastomosed to the  ascending aorta.  The air was evacuated from the grafts, and a partial  occlusion clamp was removed.  The sites of the anastomosis were inspected  and were free of bleeding.  The patient was then ventilated and weaned from  cardiopulmonary bypass __________ and Dopamine.  She remained hemodynamically stable.  She was decannulated in the usual fashion.  Protamine sulfate was administered.   With the operative field hemostatic,  two atrial and two ventricular pacing wires were applied.  A graft marker  was applied.  A left pleural tube and two mediastinal tubes were left in  place.  The sternum was closed with a #6 stainless steel wire.  The fascia  was closed with interrupted 0 Vicryl and running 3-0 Vicryl in the  subcutaneous tissue and 4-0 subcuticular stitch in the skin edges.  Dry  dressings were applied.  Sponge and  needle count was reported as correct at  the completion of the procedure.  The patient tolerated the procedure  without obvious complications and was transferred to the surgical intensive  care unit for further postoperative care.  Total clamp time was 157 minutes.  The patient did require packed red blood cells because of low hematocrit  while on bypass.                                               Gwenith Daily Tyrone Sage, M.D.    EBG/MEDQ  D:  05/12/2002  T:  05/12/2002  Job:  [id number]   cc:   Aram Candela. Aleen Campi, M.D.  [address] [place] [address]  [place]  [place] [address]  Fax: [phone]   Darlin Priestly, M.D.  [phone] [address]  [place]  [place] [address]  Fax: [phone]

## 2010-06-02 NOTE — Consult Note (Signed)
NAME:  Veronica Bowman, Veronica Bowman                            ACCOUNT NO.:  [id number]   MEDICAL RECORD NO.:  [id number]                   PATIENT TYPE:  INP   LOCATION:  2929                                 FACILITY:  [facility]   PHYSICIAN:  Salvatore Decent. Cornelius Moras, M.D.              DATE OF BIRTH:  25-Oct-1918   DATE OF CONSULTATION:  05/10/2002  DATE OF DISCHARGE:                                   CONSULTATION   PRIMARY CARDIOLOGIST:  Dr. Charolette Child.   PRIMARY CARE PHYSICIAN:  Dr. Adela Lank.   REASON FOR CONSULTATION:  Severe three-vessel coronary artery disease,  status post acute myocardial infarction.   HISTORY OF PRESENT ILLNESS:  The patient is an 77 year old female from  [place] with numerous medical problems including known history of  coronary artery disease, hypertension, hyperthyroidism, GE reflux disease  with esophageal stricture, and longstanding tobacco abuse.  She apparently  suffered an acute myocardial infarction in January 2003, after she underwent  left total knee replacement.  She was treated medically at that time and  apparently recovered uneventfully.  She was followed by Dr. Aleen Campi and  reportedly underwent an outpatient stress test in July 2003.  She was told  that everything looked fine at that time.  She has, in addition, had a  longstanding history of atypical chest pain.  She has been followed off and  on for years with history of problems with her bowels as well as GE reflux  disease.  She has been to the emergency room on numerous occasions with  episodes of burning chest and upper abdominal pain associated with shortness  of breath.  She reports that while she was during [place] in January of this  year she suffered a particularly prolonged episode of grabbing chest pain  across the left chest associated with shortness of breath.  This lasted an  entire night, but she did not seek medical attention.  A few weeks later she  had another episode of pain and did go  to the emergency room in [place].  Apparently, numerous tests were done, but she was told everything was okay.  She has also been seen on several occasions both here at [facility] and at  the emergency room at [facility] over the last couple of months.  Yesterday  she awoke from her sleep with severe burning substernal chest pain occurring  at rest.  This was associated with shortness of breath and diaphoresis.  She  telephoned Dr. Juleen China and was instructed to come to the emergency room.  There she was found to have acute EKG changes and positive cardiac enzymes,  and she was taken directly to the cardiac catheterization lab by Dr.  Jenne Campus.  Catheterization performed at that time reveals severe three-vessel  coronary artery disease with 99% subtotal occlusion of the proximal left  anterior descending coronary artery.  The distal left anterior descending  coronary artery is diffusely diseased.  Left ventricular function is  moderately reduced, with severe anteroapical akinesis.  The inferior wall  seems to move well, and overall ejection fraction is estimated 30-35%.  She  was also found to have moderate diffuse nonobstructed disease involving the  infrarenal abdominal aorta as well as bilateral renal artery stenosis.  The  patient has remained stable since that time on intravenous Integrilin and  nitroglycerin, and cardiac surgical consultation has been requested for  possible surgical revascularization.   REVIEW OF SYSTEMS:  GENERAL:  The patient has not felt well ever since her  knee surgery more than one year ago.  She has intermittent problems with  appetite and bowels, and she states that she has lost approximately five  pounds over the last year.  She has relatively limited exercise tolerance,  and she is limited by exertional shortness of breath as well as severe  problems with arthritis.  CARDIAC:  Notable for atypical symptoms of chest  pain which have waxed and waned for years.   For the most part, these are  characterized as burning substernal pains as that which brought her to the  emergency room.  She has also had occasional sharp stabbing pains across her  left chest.  She also has difficulty with food occasionally getting stuck  partway down, although she denies any history of regurgitation.  She denies  any pain on swallowing.  She denies any recent history of syncope, although  she has occasional dizzy spells.  She denies any palpitations.  She reports  a history of occasional episodes of PND, but she denies history of lower  extremity edema.  She keeps herself propped up on two pillows when she  sleeps at night.  RESPIRATORY:  Notable for the absence of any productive  cough.  She has intermittent dry cough without a history of hemoptysis or  wheezing.  GASTROINTESTINAL:  As stated previously and notable for  intermittent problems with difficulty swallowing and food getting stuck.  She has undergone dilatation of a lower esophageal stricture in the distant  past, and she has seen Dr. Sabino Gasser recently in the office.  She denies  any history of hematochezia, hematemesis, or melena.  She reports that her  bowels have been fairly regular recently.  ENDOCRINE:  Notable for alleged  history of episodes of hypoglycemia if she does not eat.  She denies history  of diabetes.  She reports that she is intolerant of THYROID SUPPLEMENT  HORMONE THERAPY.  MUSCULOSKELETAL:  Notable for longstanding problems with  arthritis afflicting both hands, both ankles, and both knees.  This limits  her ability to ambulate, and she frequently uses a motorized scooter or  pushes a cart when she goes shopping.  NEUROLOGIC:  Notable for the absence  of any history of change in monocular blindness or change in numbness or  weakness involving either upper and lower extremity.  She allegedly has a history of occasional headaches which she states are not migraines but are  close to  becoming migraines.  She has been very thoroughly evaluated in the  past and apparently was found to have small-vessel disease on MRI of the  brain in the past.  She denies a history of seizures.  HEENT:  Notable for  history of poor dentition, with only a few teeth remaining on her lower jaw.  She wears dentures.  She also has a history of bilateral cataract extraction  as well as problems with  bilateral retinal changes for which she has  undergone laser surgery in the past.  PSYCHIATRIC:  Negative.  PERIPHERAL  VASCULAR:  Negative.  The patient denies symptoms suggestive of claudication  although she did not walk much.  SKIN:  Notable in that the patient has mild  rash at present which the patient and her family state is new, question  related to medication or possible IV contrast from catheterization  yesterday.   PAST MEDICAL HISTORY:  Notable for:  1. History of coronary artery disease.  2. Hypertension.  3. GE reflux disease.  4. Lower esophageal stricture.  She most recently underwent dilatation of     the stricture five years ago by Dr. Virginia Rochester, and she has seen Dr. Virginia Rochester     recently in follow-up.  5. The patient denies any known history of diabetes, previous stroke,     congestive heart failure.   PAST SURGICAL HISTORY:  Notable for:  1. Left modified radical mastectomy in 1997, for breast cancer.  She was     told that she her lymph nodes were free of disease at that time, and she     took tamoxifen for a period of time after this.  She did not have any     radiation therapy.  2. The patient has a history of cholecystectomy performed in 1977.  This     procedure was complicated by dehiscence of her abdominal wound with     evisceration requiring a prolonged hospitalization for recovery.  3. The patient also has a history of thyroid surgery in the 1940s, for     hyperthyroidism.  4. The patient underwent left total knee replacement in January 2003.  5. She has undergone  bilateral knee arthroscopy in the past.   FAMILY HISTORY:  Notable for the absence of early-onset coronary artery  disease.  The patient's husband did undergo bypass surgery here  approximately 10 years ago, and he believes it was done by Dr. Tyrone Sage.   SOCIAL HISTORY:  The patient is married and lives with her husband.  She has  three children who are supportive.  She has a longstanding history of  tobacco use and reports that she has smoked right up to a few days ago,  although less than one-half pack of cigarettes per day.  She denies history  of excessive alcohol consumption.  She is retired and lives a very sedentary  lifestyle.   MEDICATIONS PRIOR TO ADMISSION:  Include atenolol, Lasix, potassium.  The  patient stopped taking aspirin recently due to reported retinal hemorrhage.   ALLERGIES:  The patient reports a long list of drug allergies or sensitivities which are way too numerous to count or go into at this point  in time.  They are documented in the patient's chart and difficult to  believe.   PHYSICAL EXAMINATION:  GENERAL:  Notable for a moderately obese, elderly  white female who appears her stated age, in no acute distress.  VITAL SIGNS:  She is currently afebrile and normotensive, in normal sinus  rhythm.  HEENT:  Notable for poor dentition.  NECK:  Supple.  There is no cervical or supraclavicular lymphadenopathy.  No  jugular venous distention.  No carotid bruits are noted.  CHEST:  Auscultation of the chest reveals a few bibasilar inspiratory  crackles.  No wheezes or rhonchi are noted.  Breath sounds are symmetrical.  There is a well-healed left modified radical mastectomy scar.  There are no  palpable masses.  CARDIOVASCULAR:  Demonstrates regular rate and rhythm.  No murmurs, gallops,  or rubs are noted.  ABDOMEN:  Moderately obese.  Soft and nontender.  There are no palpable  masses.  There is a well-healed large abdominal scar with no palpable  ventral  incisional hernia.  Bowel sounds are present.  EXTREMITIES:  Warm and well perfused.  There is no lower extremity edema.  Distal pulses are not palpable in either lower leg at the ankle.  There is  no venous insufficiency.  RECTAL, GENITOURINARY:  Both deferred.  NEUROLOGIC:  Grossly nonfocal and symmetrical throughout.   The remainder of the physical exam is noncontributory.   LABORATORY DATA:  Admission blood work from the time of admission yesterday  notable for a baseline hemoglobin 15.1, with hematocrit 42.9%, white blood  count 8100, platelet count 217,000.  Comprehensive metabolic panel notable  for sodium 136, potassium 3.7, chloride 105, bicarbonate 22, BUN 17,  creatinine 1.2.  Baseline serum albumin was slightly low at 3.1.  Baseline  coagulation profile was normal.  The initial set of cardiac enzymes was  abnormal, with a troponin I of 0.41 and a total CK of 93 with a CK-MB  fraction of 15.  Cardiac enzymes increased today with total CK 410, CK-MB  fraction 83, and B natriuretic peptide level elevated at 844.   Portable chest x-ray reveals very mild pulmonary vascular congestion and  mild edema with slightly increased cardiac silhouette.  There are no  opacities appreciated.   A 12-lead electrocardiogram at the time of admission was notable for Q-waves  in V1 through V3 with ST segment elevation across the anterior leads.  Today  there is restoration of some normal R-waves in the anterior leads and ST  segment elevation resolved as there is marked ST segment abnormality.   DIAGNOSTIC TESTS:  Cardiac catheterization performed by Dr. Jenne Campus has been  reviewed.  This demonstrates 99% proximal stenosis of the left anterior  descending coronary artery just before the first diagonal branch.  There is  99% proximal stenosis of the diagonal branch although there is a small and diffusely diseased vessel and likely not graftable.  There is 95% stenosis  of the left anterior  descending coronary artery after the diagonal branch as  well as in the distal vessel, and the distal vessel is diffusely diseased  and a relatively poor target for grafting.  There is 60% proximal stenosis  of the first circumflex marginal branch and 80% stenosis of the left  circumflex after takeoff of the first circumflex marginal branch.  There is  a large second circumflex marginal branch.  There is diffuse nonobstructive  disease involving the right coronary artery with approximately 50% stenosis  of the mid right coronary artery and 70% stenosis of the proximal posterior  descending coronary artery.   IMPRESSION:  Severe three-vessel coronary artery disease, status post acute  myocardial infarction.  I believe that the patient would best be treated  with surgical revascularization.  However, her risk of surgery will be  somewhat increased due to her advanced age, left ventricular dysfunction,  recent acute myocardial infarction, diffuse disease involving the left  anterior descending coronary artery, and numerous associated comorbid  conditions.   PLAN:  I have outlined options at length with the patient and her family.  All of their questions have been addressed.  Alternative treatments and  strategies have been reviewed.  They understand and accept all associated  risks of surgery including but not limited  to risk of death, stroke,  myocardial infarction, congestive heart failure, respiratory failure,  pneumonia, bleeding requiring blood transfusion, arrhythmia, infection, and  recurrent coronary artery disease.  We tentatively plan to proceed with  surgery tomorrow, to be performed by Dr. Tyrone Sage pending his review and  availability for surgery.  All of their questions have been reviewed.                                               Salvatore Decent. Cornelius Moras, M.D.    CHO/MEDQ  D:  05/10/2002  T:  05/10/2002  Job:  [id number]   cc:   Darlin Priestly, M.D.  [address]  [place]  [place] [address]  Fax: [phone]   Aram Candela. Aleen Campi, M.D.  [address]  [place]  [place] [address]  Fax: [phone]   Brooke Bonito, M.D.  [address]  [place]  [place] [address]  Fax: [phone]

## 2010-06-02 NOTE — H&P (Signed)
[place]  Patient:    Veronica Bowman, Veronica Bowman Visit Number: [id number] MRN: [id number]          Service Type: Attending:  Carlisle Beers. Dorothyann Gibbs, M.D. Dictated by:   Jamelle Rushing, P.A. Adm. Date:  02/12/01                           History and Physical  DATE OF BIRTH: [birth year]  CHIEF COMPLAINT: Left knee pain for many years.  HISTORY OF PRESENT ILLNESS: The patient is an 77 year old white female, who states she has been having left knee pain over the last several years, which has progressively worsened with time.  She describes the pain as a constant throbbing pain, with achy type qualities all the time.  It worsens with weightbearing activities.  It bothers her at night, trying to find a comfortable position.  The patient also has occasional sharp shooting pains through the knee with awkward movements.  These symptoms worsen with any type of weightbearing activity.  The patient does have grinding, catching, and giving out of her knee, causing her to fall to the floor.  She has been using a walker to assist ambulation.  The pain does not radiate from the knee, but the patient does report some superficial numbness of the skin in the left leg from the foot up into the thigh.  The patient has had improvement in the past with cortisone injections, but injection one month ago gave her absolutely no relief, and she is currently still uncomfortable.  She is requesting replacement of her knee.  X-rays of her knee show severe bone-on-bone of the medial compartment, with significant increase in sclerotic borders and osteophyte formation.  ALLERGIES: (Multiple.  The patient remembers, but reports there is a long list of others; she will provide a list with her to the hospital):  1. DEMEROL.  2. CODEINE.  3. LODINE.  4. RELAFEN.  CURRENT MEDICATIONS:  1. Atenolol 50 mg p.o. q.d.  2. Triavil 2/10 mg p.o. q.d.  3. Prevacid 30 mg p.o. q.d.  4. Lasix 20  mg p.o. p.r.n.  5. K-Dur 10 mEq p.o. p.r.n.  PAST MEDICAL HISTORY:  1. Hypertension.  2. Thyroid disease.  3. Hiatal hernia.  4. Migraines.  5. Hypoglycemia.  6. Esophageal strictures.  The patient denies any diabetes, heart disease, or respiratory problems.  PAST SURGICAL HISTORY:  1. Thyroid surgery in 1943.  2. Right knee, 1972 and 1991.  3. Gallbladder surgery in 1977.  4. Breast surgery, 1995.  5. Left knee surgery, unknown date.  The patient denies any complications with any of these procedures.  SOCIAL HISTORY: The patient is an 77 year old centrally obese white female, who smokes about half a pack a day for the past 65 years.  She denies any alcohol use.  She states she is married, lives with her husband in a [place] house.  She does have grown children.  She is currently retired.  FAMILY PHYSICIAN: Dr. Juleen China, at [phone].  FAMILY HISTORY: Mother is deceased from complications with blood clots and infections.  Father is deceased from a stroke.  The patient has two brothers deceased, one from prostate cancer, the other from cirrhosis of the liver. The patient has two sister deceased, one from lymphoma, the other from unknown cause.  She has one brother alive, with unknown medical history.  REVIEW OF SYSTEMS: Positive for frequent colds, the last being in November or  December 2002.  The patient has no residual symptoms.  She does have pain and stiffness in the neck on occasion from degenerative arthritic changes of her C-spine.  She does have upper and lower dentures.  She does use glasses at all times.  She does have problems with reflux, which is improved with Prevacid. She does have problems with nausea and vomiting, and diarrhea - mostly from spicy and unusual foods.  PHYSICAL EXAMINATION:  VITAL SIGNS: Height 5 feet 2 inches.  Weight 206 pounds.  Pulse 76 and regular.  Respirations 12.  Temperature 98.6 degrees.  Blood pressure 158/78.  GENERAL: This is a  well-developed, short-statured white female, centrally obese.  Ambulates very slowly with short deliberate steps, with her left knee with minimal range bending of her left knee to ambulate.  The patient is able to get off and on the examination table without much difficulty, but requires assistance.  HEENT: Head normocephalic, atraumatic.  Nontender over maxillary or frontal sinuses.  PERRLA.  EOMI.  External ears without deformities.  TMs intact. Gross hearing intact.  Nasal septum was midline.  Mucous membranes pink and moist.  No polyps noted.  Oral buccal mucosa pink and moist, without lesions. Upper and lower dentures were in place.  Patient able to swallow without any difficulty.  NECK: Supple.  No palpable lymphadenopathy.  Patient had no tenderness in the thyroid region.  She had approximately 50% range of motion but she was able to flex her chin to the chest.  She was able to look toward the ceiling about 45 degrees and about 45 degrees to the right and the left shoulder.  This was limited by stiffness of the neck, but no appreciable discomfort.  CHEST: Lung sounds were clear and equal bilaterally.  No wheezes, rales, rhonchi, or rubs noted.  HEART: Regular rate and rhythm.  S1 and S2 auscultated.  No murmurs, rubs, or gallops noted.  ABDOMEN: Round, obese.  Bowel sounds normoactive throughout.  Unable to palpate any hepatosplenomegaly.  CVA nontender to percussion.  EXTREMITIES: Upper extremities were symmetric size and shape.  She had relatively good range of motion of her shoulders, elbows, and wrists without any difficulty.  She had 5/5 motor strength in all muscle groups tested.  Lower extremities, right and left hip - the patient had no loss of range of motion of either hip with full extension and flexion up to 110 degrees, limited due to obesity, with 20 degrees internal and external rotation of right and left hip, but the patient does have discomfort in the left  hip with  range of motion.  Bilateral knees are symmetric size and shape.  He had no sign of erythema or ecchymosis, or soft tissue swelling.  No palpable effusion.  The patient had very prominent tibial tuberosities.  She had significant tenderness along the medial and lateral joint lines right-to-left. She had a very coarse mechanical grinding and clunking of the left knee with range of motion and valgus and varus stressing.  Range of motion was 10-95 degrees on the left and 5-100 degrees on the right.  She had no anterior or posterior drawer either knee, and bilateral calves were nontender.  Bilateral ankles were symmetric, with good dorsiflexion and plantar flexion.  Peripheral vasculature - carotid pulses were 2+, no bruits.  Radial pulses were 2+.  Unable to palpate femoral pulses.  Posterior tibial and dorsalis pedis pulses were not palpable.  She had warm, pale skin about the lower extremities, with no  significant venous stasis changes or varicosities.  NEUROLOGIC: The patient was conscious, alert, and appropriate, and held an easy conversation with the examiner.  Cranial nerves II-XII were grossly intact.  Deep tendon reflexes of the upper and lower extremities were symmetric right-to-left.  The patient was grossly intact to light touch sensation from head to toe, with the exception the patient reports some decrease in light touch sensation about the left leg from thigh on down to the foot.  Patient otherwise neurologically and motor intact in left leg.  BREAST: The patient has an obvious left chest mastectomy.  The rest of the breast examination was not performed.  GU/RECTAL: Examinations deferred.  IMPRESSION:  1. Severe osteoarthritis, bilateral knees; left greater than right.  2. Hypertension.  3. History of thyroid disease.  4. History of hiatal hernia.  5. History of migraines.  6. History of hyperglycemia.  7. History of esophageal strictures.  8.  Obesity.  PLAN: The patient will be admitted to [facility] on February 12, 2001 under the care of Dr. Jonny Ruiz L. Rendall.  The patient will undergo all routine laboratories and tests prior to undergoing a left total knee arthroplasty.  The patient has been to see Dr. Adela Lank for presurgical evaluation and has been cleared for surgery. Dictated by:   Molly Maduro . Tobey Bride. Attending:  Carlisle Beers. Dorothyann Gibbs, M.D. DD:  02/06/01 TD:  02/06/01 Job: [id number]

## 2010-06-02 NOTE — H&P (Signed)
NAME:  Veronica Bowman, Veronica Bowman                            ACCOUNT NO.:  [id number]   MEDICAL RECORD NO.:  [id number]                   PATIENT TYPE:  INP   LOCATION:  5012                                 FACILITY:  [facility]   PHYSICIAN:  Renato Battles, M.D.                  DATE OF BIRTH:  04-18-1918   DATE OF ADMISSION:  08/15/2003  DATE OF DISCHARGE:                                HISTORY & PHYSICAL   PRIMARY CARE:  Adela Lank, M.D.   REASON FOR ADMISSION:  Acute back pain.   HISTORY OF PRESENT ILLNESS:  The patient is a very pleasant 77 year old  white female with a back pain that has been getting progressively worse for  the last 3 weeks.  The patient visited her primary care doctor and received  MRI earlier last week and she was diagnosed with a compression fracture of  her back.  She was given oxycodone, Phenergan, and a laxative.  However, the  pain continued to get worse and the patient finally decided to come to the  emergency room because the pain became so intolerable and she was unable to  walk around or move without assistance.   REVIEW OF SYSTEMS:  CONSTITUTIONAL:  No fever, chills, or night sweats, no  weight changes.  GI:  Positive for nausea as a result of the oxycodone.  No  diarrhea or constipation.  No vomiting. CARDIOPULMONARY:  No chest pain,  shortness of breath.  No orthopnea or PND.  GU:  No dysuria, hematuria, or  retention.   PAST MEDICAL HISTORY:  1. CAD status post CABG.  2. GERD.  3. Breast CA.  4. Hypertension.  5. Osteoarthritis.   PAST SURGICAL HISTORY:  1. CABG in April of 2004.  2. Left knee replacement.  3. Bilateral knee arthroplasty.  4. Cholecystectomy.  5. History of thyroid surgery in the 1940s.  6. Modified left radical mastectomy.   FAMILY HISTORY:  Noncontributory.   SOCIAL HISTORY:  The patient lives with her husband.  She is functional.  Does not smoke or drink or use drugs.   ALLERGIES:  The patient has multiple ALLERGIES including  SULFA, VIOXX,  PENICILLIN, DEMEROL, REGLAN, COZAAR, ZANTAC, CODEINE, MORPHINE and MOTRIN  which causes her GI distress.   HOME MEDICATIONS:  1. Aspirin 325 mg p.o. daily.  2. Digoxin 125 mcg p.o. daily.  3. Folic acid 1 mg p.o. daily.  4. Coreg 6.25 mg p.o. b.i.d.  5. Colace.  6. Combivent.  7. Oxycodone.  8. Phenergan.   PHYSICAL EXAMINATION:  GENERAL:  Alert and oriented x3, in mild distress.  VITAL SIGNS:  Temperature 97.4, heart rate 76, respiratory rate 19, blood  pressure 156/56.  Oxygen saturation 95% on room air.  HEENT:  Head is normocephalic, atraumatic.  Pupils are equal, round, and  reactive to light and accommodation.  Extraocular movement intact  bilaterally.  NECK:  No  lymphadenopathy, no thyromegaly, no JVD.  CHEST:  Clear to auscultation bilaterally.  No wheezing, rales, or rhonchi.  HEART:  Regular rate and rhythm.  No murmur, gallop, or rubs.  ABDOMEN:  Soft, nontender, nondistended.  Normoactive bowel sounds.  EXTREMITIES:  No cyanosis, edema, or clubbing.   STUDIES:  Chemistries show normal electrolytes, normal BUN and creatinine,  normal glucose.  Liver function showed marginally low albumin at 3.3, normal  otherwise.   CBC showed white count 9.8, hemoglobin 15.4, platelets 297.   Urinalysis was negative.   ASSESSMENT AND PLAN:  1. Back pain.  Most likely secondary to exacerbation of compression     fractures, going to check MRI.  Start the patient on Dilaudid realizing     that choices for analgesia for the patient are very limited secondary to     her multiple ALLERGIES.  Also IBUPROFEN will be used although it caused     the patient GI distress.  Am going to cover her with high-dose Protonix.  2. Coronary artery disease.  The patient has no chest pain, no signs of     ischemic heart event.  Continue aspirin and Coreg.  Monitor daily I's and     O's to avoid volume overloading.  3. Dehydration.  Going to give the patient gentle IV hydration.  4.  Gastroesophageal reflux disease.  High-dose Protonix.  5. Osteoarthritis.  Cover with IBUPROFEN.                                                Renato Battles, M.D.    SA/MEDQ  D:  08/15/2003  T:  08/15/2003  Job:  [id number]   cc:   Adela Lank, M.D.

## 2010-06-02 NOTE — H&P (Signed)
NAME:  Dibiasio, Shaela                  ACCOUNT NO.:  [id number]   MEDICAL RECORD NO.:  [id number]          PATIENT TYPE:  INP   LOCATION:  4703                         FACILITY:  [facility]   PHYSICIAN:  Mark A. Perini, M.D.   DATE OF BIRTH:  14-Dec-1918   DATE OF ADMISSION:  08/04/2004  DATE OF DISCHARGE:                                HISTORY & PHYSICAL   CHIEF COMPLAINT:  Dyspnea and dyspnea on exertion, which is progressive.   HISTORY OF PRESENT ILLNESS:  Ms. Alberson is a pleasant 77 year old female who  presented to my office today as a new patient visit. She has multiple  complicated past medical history diagnoses including atherosclerotic  coronary disease, congestive heart failure, severe osteoporosis, history of  colovesical fistula, status post partial colectomy and subsequent colostomy  in the left lower quadrant, remote mastectomy, and hypertension. She  recently underwent vertebroplasty procedures at the T7 and T10 levels for  compression fractures and severe back pain. At this time her back pain is  actually somewhat improved. She presents with her daughter in attendance.  She reports that over the last two to four weeks she has had worsening  dyspnea and dyspnea on exertion. She gets weak and worn out even with  walking short distances in the house. She has been sleeping mostly in a  recliner lately.  She has no definite chest pain, but she does feel like a  tightness in her chest when she is short of breath. She denies any recent  fevers or significant coughing. However, she does report some paroxysmal  nocturnal dyspnea. Her appetite has been somewhat poor for the last several  years, but there has been no acute change in this.  Her weight is down 3  pounds over  the last two weeks. She denies any new peripheral edema. When  she presented to our office she was quite dyspneic with a pulse of 104 and a  room air saturation of 92%. This increased to 95% with one liter of oxygen.  It  was felt that she would require admission for further evaluation of her  dyspnea.   PAST MEDICAL HISTORY:  1.  Hospitalized in 1980 for an episode of diverticulitis.  2.  Status post cholecystectomy in 1977.  3.  Severe osteoporosis with multiple vertebral compression fractures.  4.  Colovesical fistula, status post two feet of bowel removed and      subsequent left lower lobe colostomy placed in December 2005.  5.  Partial thyroidectomy in 1944 and she is status post radioactive iodine      treatment at that time.  6.  Left total knee arthroplasty in 2003.  7.  Skin cancer removed in 2005, but she states this is a non-melanoma.  8.  Pneumonia in 2006.  9.  Left mastectomy in 1995 with no lymph nodes positive. She did receive      tamoxifen for one year, but stopped it early due to side-effects.  10. Atherosclerotic coronary disease, status post six-vessel coronary artery      bypass grafting in 2004.  11.  Congestive heart failure with a recent echocardiogram showing an      ejection fraction of 40% to 45%.  12. D&C in 1965.  13. G3, P3 parity status with all vaginal deliveries.  14. History of esophageal dilatation times two, the last of which was in      1996.  15. Hypertension.  16. History of hypoglycemia.  17. Right lower lung calcified granuloma.  18. Chronically elevated right hemidiaphragm.  19. Allergic rhinitis.   ALLERGIES:  PENICILLIN which causes a rash.  SULFA which causes mouth to  burn.  ANESTHETICS cause her problems, CODEINE and DEMEROL.  She is  intolerant to as well as BARBITURATES. She states that any thyroid  replacement causes violent headaches and tamoxifen caused her to feel  burning and hot all over.   CURRENT MEDICATIONS:  1.  Toprol XL 50 mg daily.  2.  Lasix 40 mg one-half tablet daily.  3.  Potassium over-the-counter 99 mg daily.  4.  Aspirin 325 mg daily.  5.  Calcium 1000 mg b.i.d.  6.  Vicodin one to two tablets q.6h., but she only uses two  tablets a day.  7.  Antivert rarely for dizziness.  8.  Ensure one can daily.  9.  Claritin 10 mg daily.  10. Albuterol meter dose inhaler which she has been using two times daily.   SOCIAL HISTORY:  Lasha is married. Her husband's name is Leonette Most. They have  been married since 39. She has three children, three grandchildren, and  six great-grandchildren. She has two living daughters. One daughter died of  type 1 diabetes at the age of 22. She has 12 years of education. She had a  30-40 pack smoking history, but quit in 2004. No alcohol history. No drug  use history.   FAMILY HISTORY:  Father died at age 59 of a stroke. Mother died at age 13 of  a hernia operation. She had two brothers and two sisters who are all  deceased. Of note one brother had prostate cancer and one sister had colon  cancer.   REVIEW OF SYSTEMS:  As per the history of present illness. The patient  states that her vision and hearing are doing fairly well. Her bad back pain  has somewhat improved lately. She does report some significant reflux  symptoms, but has not been taking anything for reflux lately. She states  that she has noticed some gas passing through her urethra and occasionally  passes some slimy or gravel type substance through her urine for the last  month. She denies any significant arthritic problems. She has had no skin  rashes. Her mood is notable for some degree of chronic anxiety and  depression, but this has not really been changed recently. She does have  some allergy symptoms. She feels a burning throughout her belly at times and  she has some early satiety which she notes as well. Her right leg feels numb  on and off a little bit at times.   PHYSICAL EXAMINATION:  VITAL SIGNS: Weight 173, height 5 feet 2 inches,  blood pressure 140/80, pulse 104, saturation 95% on one liter of nasal  cannula oxygen.  GENERAL: She is mildly dyspneic. She is alert, oriented, and a fairly  good historian.  HEENT:  She has no icterus or pallor.  Tympanic membranes are normal. Oral  mucosa is moist and pink. There is 2-3 cm of JVD noted.  LUNGS: There are decreased breath sounds bilaterally with some crackles  noted to midway of both lung fields on inspiration. No rhonchi or wheezes  noted currently.  HEART: Irregularly irregular with no significant murmur auscultated. There  is no significant edema or varicosities.  ABDOMEN: Normoactive bowel sounds. There are no masses. She does have some  mild epigastric tenderness, but no rebound or guarding. Her colostomy site  appears clean, dry, and intact. There is no lymphadenopathy noted. No  worrisome skin lesions noted.  NEUROLOGIC: Grossly intact.   LABORATORY DATA:  EKG in the office was somewhat concerning for septal Q-  waves with possibly developing T-wave inversions in the septal leads.  However, repeat EKG at the hospital does not confirm septal Q-waves and  actually looks the same as her most recent EKG done in Dr. Silva Bandy office.  She has normal sinus rhythm with some PVCs. She does reach voltage criteria  for LVH and some nonspecific T-wave flattening in the lateral leads. Chest x-  ray shows recent T7 to T10 vertebroplasty. Her right hemidiaphragm is  elevated and she has a 3 x 5 mm calcified right basilar granuloma. She has  diffuse peribronchial thickening. No definite edema. There is cardiac  enlargement present. The aorta is elongated and mildly dilated. PTT is 29,  INR 1.0. Phosphorus 3.9, magnesium 2.1. CK 34, MB 1.4, troponin-I 0.02.  BNP  is 126. White count 8.8 with a normal differential, hemoglobin 14.9,  platelet count 253,000. Alkaline phosphatase is somewhat low at 29, AST 18,  ALT 9, total protein 7.5. Sodium 138, potassium 4.1, chloride 105, CO2 26,  BUN 23, creatinine 1.3, glucose 104. Creatinine clearance estimated at 25 mL  per minute. Albumin 3.6, calcium 9.2.   Reportedly had a negative  Cardiolite test for ischemia in December 2005. Her  echocardiogram from April 27, 2004, shows an ejection fraction of 40% to 45%  with normal LV size. There was hypokinesis at the inferior and anterior  septum as well as the mid distal anterior wall. She had normal RV size and  function. She had evidence of some diastolic dysfunction. She also had mild  aortic mitral valve sclerosis and mild aortic insufficiency, and trace  mitral and tricuspid valve regurgitation with normal pulmonary artery  pressure. CT scan of the chest done in April 2006 showed a stable right  lower lobe granuloma, a small amount of pleural and parenchymal scarring at  the left lateral lung base, but no acute abnormality.   IMPRESSION AND RECOMMENDATIONS:  1.  An 77 year old female with progressive dyspnea on exertion and dyspnea.      Her initial EKG and cardiac enzymes are not suggestive of an acute      coronary syndrome and she has not have any significant chest pains.     There is no evidence of pneumonia on her chest x-ray as well. She may      have some chronic underlying lung disease such as some chronic      obstructive pulmonary disease with an exacerbation and this is my      leading reason for her current worsening of symptoms. However, she could      have a PE and we will check a spiral CT scan to rule this out.  Her      congestive heart failure seems to be fairly well compensated at this      time. She could still be having some element of angina or diastolic      dysfunction associated with her shortness of breath.  We  will continue      her home medications and place her on treatment of low-dose Lovenox      until the CT scan is done. We will treat with Atrovent and Pulmicort      nebulizers as well as Xopenex nebulizers. I will cover her with      azithromycin for possible acute bronchitis. Will given Mucinex and low-      dose Xanax as needed for anxiety. She also may have a recurrent       colovesical fistula. I will check a urinalysis and culture. My hope      would be to defer any workup of this to the outpatient arena with a      urologist if this is deemed necessary.  2.  Chronic renal insufficiency. Follow BUN and creatinine daily.  3.  Osteoporosis. This will need to be addressed further as an outpatient.       MAP/MEDQ  D:  08/04/2004  T:  08/04/2004  Job:  [id number]   cc:   Elmore Guise., M.D.  [address]  [place], [place] [address]  Fax: [phone]   Gita Kudo, M.D.  [address], [address]  [place]  [place] [address]   Grandville Silos. Corliss Skains, M.D.  [address]  [place]  [place] [address]  Fax: [phone]

## 2010-06-22 ENCOUNTER — Ambulatory Visit
Admission: RE | Admit: 2010-06-22 | Discharge: 2010-06-22 | Disposition: A | Discharge status: Home / Self Care | Payer: Medicare Other | Source: Ambulatory Visit | Attending: Oncology | Admitting: Oncology

## 2010-06-22 DIAGNOSIS — Z853 Personal history of malignant neoplasm of breast: Secondary | ICD-10-CM

## 2010-06-22 DIAGNOSIS — N63 Unspecified lump in unspecified breast: Secondary | ICD-10-CM

## 2010-06-22 DIAGNOSIS — Z780 Asymptomatic menopausal state: Secondary | ICD-10-CM

## 2010-10-07 ENCOUNTER — Other Ambulatory Visit: Payer: Self-pay | Admitting: Cardiology

## 2010-10-09 MED ORDER — AMLODIPINE BESYLATE 5 MG PO TABS: 5.0000 mg | ORAL_TABLET | Freq: Every day | ORAL | Status: AC

## 2010-10-09 NOTE — Telephone Encounter (Signed)
escribe medication per fax request  

## 2010-11-01 LAB — COMPREHENSIVE METABOLIC PANEL
BUN: 31 — ABNORMAL HIGH
Calcium: 9.1
Glucose, Bld: 106 — ABNORMAL HIGH
Total Protein: 7.3

## 2010-11-01 LAB — DIFFERENTIAL
Basophils Absolute: 0
Basophils Relative: 1
Eosinophils Absolute: 0.3
Eosinophils Relative: 4
Lymphocytes Relative: 23
Lymphs Abs: 1.9
Monocytes Absolute: 0.7
Monocytes Relative: 8
Neutro Abs: 5.4
Neutrophils Relative %: 65

## 2010-11-01 LAB — URINALYSIS, ROUTINE W REFLEX MICROSCOPIC
Bilirubin Urine: NEGATIVE
Glucose, UA: NEGATIVE
Ketones, ur: NEGATIVE
Leukocytes, UA: NEGATIVE
Nitrite: NEGATIVE
Protein, ur: NEGATIVE
Specific Gravity, Urine: 1.012
Urobilinogen, UA: 0.2
pH: 5.5

## 2010-11-01 LAB — COMPREHENSIVE METABOLIC PANEL WITH GFR
ALT: 16
AST: 23
Albumin: 3.5
Alkaline Phosphatase: 12 — ABNORMAL LOW
CO2: 26
Chloride: 105
Creatinine, Ser: 2.16 — ABNORMAL HIGH
GFR calc Af Amer: 26 — ABNORMAL LOW
GFR calc non Af Amer: 22 — ABNORMAL LOW
Potassium: 4.1
Sodium: 140
Total Bilirubin: 0.6

## 2010-11-01 LAB — CBC
HCT: 37.9
Hemoglobin: 12.8
MCHC: 33.8
MCV: 94.7
Platelets: 254
RBC: 4
RDW: 13.4
WBC: 8.3

## 2010-11-01 LAB — URINE MICROSCOPIC-ADD ON

## 2010-11-01 LAB — LIPASE, BLOOD: Lipase: 38

## 2010-11-24 ENCOUNTER — Ambulatory Visit (HOSPITAL_BASED_OUTPATIENT_CLINIC_OR_DEPARTMENT_OTHER): Payer: Medicare Other | Admitting: Oncology

## 2010-11-24 ENCOUNTER — Other Ambulatory Visit: Payer: Self-pay | Admitting: Oncology

## 2010-11-24 ENCOUNTER — Other Ambulatory Visit: Payer: Medicare Other | Admitting: Lab

## 2010-11-24 ENCOUNTER — Other Ambulatory Visit (HOSPITAL_BASED_OUTPATIENT_CLINIC_OR_DEPARTMENT_OTHER): Payer: Medicare Other | Admitting: Lab

## 2010-11-24 ENCOUNTER — Ambulatory Visit: Payer: Medicare Other | Admitting: Oncology

## 2010-11-24 DIAGNOSIS — M810 Age-related osteoporosis without current pathological fracture: Secondary | ICD-10-CM

## 2010-11-24 DIAGNOSIS — C50419 Malignant neoplasm of upper-outer quadrant of unspecified female breast: Secondary | ICD-10-CM

## 2010-11-24 DIAGNOSIS — C50919 Malignant neoplasm of unspecified site of unspecified female breast: Secondary | ICD-10-CM

## 2010-11-24 DIAGNOSIS — N289 Disorder of kidney and ureter, unspecified: Secondary | ICD-10-CM

## 2010-11-24 DIAGNOSIS — E559 Vitamin D deficiency, unspecified: Secondary | ICD-10-CM

## 2010-11-24 DIAGNOSIS — I10 Essential (primary) hypertension: Secondary | ICD-10-CM

## 2010-11-24 LAB — CBC WITH DIFFERENTIAL/PLATELET
EOS%: 5.9 % (ref 0.0–7.0)
Eosinophils Absolute: 0.4 10*3/uL (ref 0.0–0.5)
MCV: 94.6 fL (ref 79.5–101.0)
MONO%: 9 % (ref 0.0–14.0)
NEUT#: 3.9 10*3/uL (ref 1.5–6.5)
RBC: 4.03 10*6/uL (ref 3.70–5.45)
RDW: 14.2 % (ref 11.2–14.5)
WBC: 6.2 10*3/uL (ref 3.9–10.3)

## 2010-11-24 MED ORDER — DENOSUMAB 60 MG/ML ~~LOC~~ SOLN
60.0000 mg | Freq: Once | SUBCUTANEOUS | Status: AC
  Administered 2010-11-24: 60 mg via SUBCUTANEOUS
  Filled 2010-11-24: qty 1

## 2010-11-24 MED ORDER — DENOSUMAB 60 MG/ML ~~LOC~~ SOLN: 60.0000 mg | Freq: Once | SUBCUTANEOUS | Status: AC

## 2010-11-24 NOTE — Progress Notes (Signed)
Hematology and Oncology Follow Up Visit  Veronica Bowman [id number] 04/15/1918 77 y.o. 11/24/2010 2:41 PM   Principle Diagnosis: hx of Breast cancer ( ER+) on neoadjuvant femara since 5/09;   Interim History:  No intercurrent illness, or hospitilizations   Medications: I have reviewed the patient's current medications.  Allergies: No Known Allergies  Past Medical History, Surgical history, Social history, and Family History were reviewed and updated.  Review of Systems: Constitutional:  Negative for fever, chills, night sweats, anorexia, weight loss, pain. Cardiovascular: negative Respiratory: chronic doe Neurological: negative Dermatological: negative ENT: negative Skin Gastrointestinal: negative Genito-Urinary: negative Hematological and Lymphatic: negative Breast: negative for breast lumps negative Musculoskeletal: negative Remaining ROS negative.  Physical Exam: Blood pressure 126/70, pulse 71, temperature 98.1 F (36.7 C), height 5\' 2"  (1.575 m), weight 207 lb 9.6 oz (94.167 kg). ECOG: 1 General appearance: alert and cooperative Head: Normocephalic, without obvious abnormality, atraumatic Neck: no adenopathy, no carotid bruit, no JVD, supple, symmetrical, trachea midline and thyroid not enlarged, symmetric, no tenderness/mass/nodules Lymph nodes: Cervical, supraclavicular, and axillary nodes normal. Cardiac : Normal heart sounds Pulmonary: Normal breath sounds  Breasts: Right breast no appreciable masses, status post left mastectomy Abdomen: No organomegaly or masses Extremities no Neuro: Broad-based gait  Lab Results: Lab Results  Component Value Date   WBC 7.5 07/20/2009   HGB 13.0 11/24/2010   HCT 38.1 11/24/2010   MCV 94.6 11/24/2010   PLT 192 11/24/2010     Chemistry      Component Value Date/Time   NA 140 05/24/2010 1238   NA 140 05/24/2010 1238   K 4.2 05/24/2010 1238   K 4.2 05/24/2010 1238   CL 103 05/24/2010 1238   CL 103 05/24/2010 1238   CO2 21 05/24/2010  1238   CO2 21 05/24/2010 1238   BUN 40* 05/24/2010 1238   BUN 40* 05/24/2010 1238   CREATININE 1.98* 05/24/2010 1238   CREATININE 1.98* 05/24/2010 1238      Component Value Date/Time   CALCIUM 9.7 05/24/2010 1238   CALCIUM 9.7 05/24/2010 1238   ALKPHOS 14* 05/24/2010 1238   ALKPHOS 14* 05/24/2010 1238   AST 15 05/24/2010 1238   AST 15 05/24/2010 1238   ALT 10 05/24/2010 1238   ALT 10 05/24/2010 1238   BILITOT 0.4 05/24/2010 1238   BILITOT 0.4 05/24/2010 1238       Radiological Studies: chest X-ray N./A Mammogram Most recent mammogram shows stable breast lump in right breast drink 2 x 1 cm Bone density Most recent bone density shows ongoing osteoporosis  Impression and Plan: Ms. Maia Plan will receive probably a today. She is leaving with her daughter to [place] for 6 months I'll see her in followup in April. She is tolerating the Prolene and Femara now with stable disease in her breast. She is clearly not a operative candidate.  More than 50% of the visit was spent in patient-related counselling   Pierce Crane, MD 11/9/20122:41 PM

## 2010-11-25 LAB — COMPREHENSIVE METABOLIC PANEL
ALT: <8 U/L (ref 0–35)
AST: 13 U/L (ref 0–37)
Albumin: 4.1 g/dL (ref 3.5–5.2)
Alkaline Phosphatase: 12 U/L — ABNORMAL LOW (ref 39–117)
Glucose, Bld: 107 mg/dL — ABNORMAL HIGH (ref 70–99)
Potassium: 4.3 mEq/L (ref 3.5–5.3)
Sodium: 140 mEq/L (ref 135–145)
Total Protein: 7.1 g/dL (ref 6.0–8.3)

## 2010-11-27 ENCOUNTER — Telehealth: Payer: Self-pay | Admitting: *Deleted

## 2010-11-27 ENCOUNTER — Encounter: Payer: Self-pay | Admitting: *Deleted

## 2010-11-27 NOTE — Telephone Encounter (Signed)
RECEIVED A FAX FROM [facility] CONCERNING A PRIOR AUTHORIZATION FOR PROLIA. THIS REQUEST WAS PLACED IN THE MANAGED CARE BIN.

## 2010-12-11 ENCOUNTER — Other Ambulatory Visit: Payer: Self-pay | Admitting: Oncology

## 2010-12-12 ENCOUNTER — Other Ambulatory Visit: Payer: Self-pay | Admitting: Oncology

## 2011-01-08 ENCOUNTER — Other Ambulatory Visit: Payer: Self-pay | Admitting: Cardiology

## 2011-01-10 ENCOUNTER — Other Ambulatory Visit: Payer: Self-pay | Admitting: *Deleted

## 2011-01-11 ENCOUNTER — Other Ambulatory Visit: Payer: Self-pay | Admitting: Cardiology

## 2011-02-14 ENCOUNTER — Other Ambulatory Visit: Payer: Self-pay | Admitting: Oncology

## 2011-02-14 DIAGNOSIS — C50419 Malignant neoplasm of upper-outer quadrant of unspecified female breast: Secondary | ICD-10-CM

## 2011-02-14 DIAGNOSIS — M810 Age-related osteoporosis without current pathological fracture: Secondary | ICD-10-CM

## 2011-03-30 ENCOUNTER — Other Ambulatory Visit: Payer: Self-pay

## 2011-03-30 MED ORDER — FUROSEMIDE 40 MG PO TABS: 40.0000 mg | ORAL_TABLET | Freq: Every day | ORAL | Status: AC

## 2011-04-02 ENCOUNTER — Other Ambulatory Visit: Payer: Self-pay | Admitting: Cardiology

## 2011-05-09 ENCOUNTER — Encounter: Payer: Self-pay | Admitting: *Deleted

## 2011-06-15 ENCOUNTER — Telehealth: Payer: Self-pay | Admitting: *Deleted

## 2011-06-15 NOTE — Telephone Encounter (Signed)
patient's daughter called in requesting patient an appointment with Dr. Donnie Coffin

## 2011-07-05 ENCOUNTER — Other Ambulatory Visit: Payer: Self-pay | Admitting: *Deleted

## 2011-07-05 ENCOUNTER — Encounter: Payer: Self-pay | Admitting: *Deleted

## 2011-07-06 ENCOUNTER — Ambulatory Visit: Payer: Medicare Other | Admitting: Oncology

## 2011-07-06 ENCOUNTER — Other Ambulatory Visit: Payer: Medicare Other

## 2012-05-27 ENCOUNTER — Telehealth: Payer: Self-pay | Admitting: Oncology

## 2012-05-27 NOTE — Telephone Encounter (Signed)
S/W SUE IN RE TO APPT 05/20 @ 10:30 W/DR. KHAN REFERRING DR MARK PERINI DX- BRAIN TUMOR WELCOME PACKET MAILED.

## 2012-06-02 ENCOUNTER — Other Ambulatory Visit: Payer: Self-pay | Admitting: Emergency Medicine

## 2012-06-02 ENCOUNTER — Telehealth: Payer: Self-pay | Admitting: Oncology

## 2012-06-02 DIAGNOSIS — Z853 Personal history of malignant neoplasm of breast: Secondary | ICD-10-CM

## 2012-06-02 NOTE — Telephone Encounter (Signed)
C/D 06/02/12 for appt. 06/03/12 °

## 2012-06-03 ENCOUNTER — Telehealth: Payer: Self-pay | Admitting: *Deleted

## 2012-06-03 ENCOUNTER — Other Ambulatory Visit (HOSPITAL_BASED_OUTPATIENT_CLINIC_OR_DEPARTMENT_OTHER): Payer: Medicare Other | Admitting: Lab

## 2012-06-03 ENCOUNTER — Encounter: Payer: Self-pay | Admitting: Oncology

## 2012-06-03 ENCOUNTER — Ambulatory Visit (HOSPITAL_BASED_OUTPATIENT_CLINIC_OR_DEPARTMENT_OTHER): Payer: Medicare Other

## 2012-06-03 ENCOUNTER — Ambulatory Visit (HOSPITAL_BASED_OUTPATIENT_CLINIC_OR_DEPARTMENT_OTHER): Payer: Medicare Other | Admitting: Oncology

## 2012-06-03 VITALS — BP 156/73 | HR 65 | Temp 96.9°F | Resp 20 | Ht 62.0 in | Wt 174.7 lb

## 2012-06-03 DIAGNOSIS — Z853 Personal history of malignant neoplasm of breast: Secondary | ICD-10-CM

## 2012-06-03 DIAGNOSIS — C50919 Malignant neoplasm of unspecified site of unspecified female breast: Secondary | ICD-10-CM

## 2012-06-03 DIAGNOSIS — M810 Age-related osteoporosis without current pathological fracture: Secondary | ICD-10-CM

## 2012-06-03 LAB — CBC WITH DIFFERENTIAL/PLATELET
BASO%: 0.7 % (ref 0.0–2.0)
LYMPH%: 18.3 % (ref 14.0–49.7)
MCH: 32.8 pg (ref 25.1–34.0)
MCHC: 33.7 g/dL (ref 31.5–36.0)
MCV: 97.4 fL (ref 79.5–101.0)
MONO%: 8.9 % (ref 0.0–14.0)
Platelets: 189 10*3/uL (ref 145–400)
RBC: 4.19 10*6/uL (ref 3.70–5.45)

## 2012-06-03 LAB — COMPREHENSIVE METABOLIC PANEL (CC13)
ALT: 6 U/L (ref 0–55)
Alkaline Phosphatase: 12 U/L — ABNORMAL LOW (ref 40–150)
Creatinine: 1.6 mg/dL — ABNORMAL HIGH (ref 0.6–1.1)
Sodium: 144 mEq/L (ref 136–145)
Total Bilirubin: 0.3 mg/dL (ref 0.20–1.20)
Total Protein: 7.3 g/dL (ref 6.4–8.3)

## 2012-06-03 MED ORDER — ANASTROZOLE 1 MG PO TABS: 1.0000 mg | ORAL_TABLET | Freq: Every day | ORAL | Status: AC

## 2012-06-03 NOTE — Progress Notes (Signed)
OFFICE PROGRESS NOTE  CC Dr. Rodrigo Ran  DIAGNOSIS: 79 year old female the diagnosis of bilateral breast cancers.  PRIOR THERAPY:  #1 She has a history of breast cancer in the contralateral breast, status post mastectomy.  She has not had a recent mammogram and in fact noted a mass in her right breast for about two years.  She was having a cardiac workup of some kind, had other imaging studies, which noted the mass in the right breast.  She subsequently was referred for mammogram.  A right diagnostic mammogram and right breast ultrasound performed on 06/06/07 showed a 3 cm spiculated mass, inner aspect, right breast associated with skin retraction.  Right breast was negative.  Ultrasound of the right axilla was difficult.  A biopsy was recommended.  Biopsy of the mass on 06/06/07 showed this to be invasive ductal cancer of intermediate nuclear grade, ER positive at 100%, PR positive at 99%, proliferative index 5%, and HER-2 was 1+  #2 patient was seen by Dr. Pierce Crane and it was opted that she should proceed with neoadjuvant treatment. She was started on Arimidex 1 mg daily in 2009. She has been tolerating it well without any problems.her last visit with Dr. Donnie Coffin was in 2012.  #3 Most recently patient had altered mental status change and she was seen at [facility] facility. CT of the head was obtained that showed possibility of metastatic disease to the bones. Because of this she was seen at her primary care physician's office. Because patient had not been seen by oncology for quite some time she is referred to resume oncologic care.  #4 patient had been on bisphosphonates for severe osteoporosis but she broke her right hip and she was taken off of them because of this potential side effects.  CURRENT THERAPY:Arimidex 1 mg daily  INTERVAL HISTORY: Veronica Bowman 79 y.o. female returns forfollowup visit after 2 years of absence and lost to followup. She is accompanied  by her daughter. She continues to take the Arimidex religiously every day. She tells me that she is tolerating it well. She does state that she was seen in the emergency room with altered mental status but this is now improved significantly. Patient's daughter also tells me that she's had some difficulty with mentation specifically her memory is not as good as it used to be. Patient herself has not had any nausea or vomiting no fevers or chills or aches or pains.  MEDICAL HISTORY: Past Medical History  Diagnosis Date  . Coronary artery disease   . COPD (chronic obstructive pulmonary disease)   . Breast cancer   . Diverticulitis   . HTN (hypertension)   . HLD (hyperlipidemia)     ALLERGIES:  has no allergies on file.  MEDICATIONS:  Current Outpatient Prescriptions  Medication Sig Dispense Refill  . anastrozole (ARIMIDEX) 1 MG tablet Take 1 tablet (1 mg total) by mouth daily.  30 tablet  6  . aspirin 325 MG tablet Take 325 mg by mouth daily.      Marland Kitchen atenolol (TENORMIN) 50 MG tablet TAKE 1 TABLET BY MOUTH EVERY DAY  30 tablet  2  . Cholecalciferol (VITAMIN D) 2000 UNITS tablet Take 2,000 Units by mouth daily.      . diazepam (VALIUM) 10 MG tablet Take 10 mg by mouth at bedtime as needed for anxiety (1/2 to 1 tablet as needed).      Marland Kitchen levothyroxine (SYNTHROID, LEVOTHROID) 50 MCG tablet Take 50 mcg by mouth  daily before breakfast.      . nitroGLYCERIN (NITRODUR - DOSED IN MG/24 HR) 0.4 mg/hr Place 1 patch onto the skin daily.      Marland Kitchen amLODipine (NORVASC) 5 MG tablet Take 1 tablet (5 mg total) by mouth daily.  30 tablet  2  . GARLIC PO Take by mouth daily.       No current facility-administered medications for this visit.    SURGICAL HISTORY:  Past Surgical History  Procedure Laterality Date  . Coronary artery bypass graft    . Thyroidectomy    . Mastectomy    . Colostomy    . Cholecystectomy    . Total knee arthroplasty      left    REVIEW OF SYSTEMS:  Pertinent items are noted in  HPI.   HEALTH MAINTENANCE:  Mammogram June 2012 Colonoscopy unknown Bone  Scan June 2012 Pap Smear many years ago Eye Exam unknown Vitamin D November 2012 level was 36 Lipid Panel unknown  PHYSICAL EXAMINATION: Blood pressure 156/73, pulse 65, temperature 96.9 F (36.1 C), temperature source Oral, resp. rate 20, height 5\' 2"  (1.575 m), weight 174 lb 11.2 oz (79.243 kg). Body mass index is 31.94 kg/(m^2). ECOG PERFORMANCE STATUS: 2 - Symptomatic, <50% confined to bed   General appearance: alert, appears stated age, distracted, fatigued and no distress Resp: Distant breath sounds Cardio: regular rate and rhythm Extremities: +2 edema Neurologic: Grossly normal Right breast reveals a palpable mass it is soft measures approximately 2 cm LABORATORY DATA: Lab Results  Component Value Date   WBC 6.0 06/03/2012   HGB 13.7 06/03/2012   HCT 40.8 06/03/2012   MCV 97.4 06/03/2012   PLT 189 06/03/2012      Chemistry      Component Value Date/Time   NA 144 06/03/2012 1035   NA 140 11/24/2010 1238   K 4.4 06/03/2012 1035   K 4.3 11/24/2010 1238   CL 104 06/03/2012 1035   CL 101 11/24/2010 1238   CO2 31* 06/03/2012 1035   CO2 29 11/24/2010 1238   BUN 26.6* 06/03/2012 1035   BUN 25* 11/24/2010 1238   CREATININE 1.6* 06/03/2012 1035   CREATININE 1.90* 11/24/2010 1238      Component Value Date/Time   CALCIUM 9.6 06/03/2012 1035   CALCIUM 9.4 11/24/2010 1238   ALKPHOS 12* 06/03/2012 1035   ALKPHOS 12* 11/24/2010 1238   AST 14 06/03/2012 1035   AST 13 11/24/2010 1238   ALT 6 06/03/2012 1035   ALT <8 11/24/2010 1238   BILITOT 0.30 06/03/2012 1035   BILITOT 0.4 11/24/2010 1238       RADIOGRAPHIC STUDIES:  No results found.  ASSESSMENT: 79 year old female with  #1 history of bilateral breast cancers. On the left side patient underwent a mastectomy over 20 years ago. On the right side she was diagnosed with a mass in 2009. She was not a candidate for surgery so therefore she was begun on neoadjuvant  antiestrogen therapy with Arimidex 1 mg daily. She's had some response over the years. But unfortunately she was lost to followup. Most recently seen in the [facility] ER with altered mental status and possibility of metastatic bony disease. Clinically she is asymptomatic.  #2 history of bisphosphonate associated fractures.     PLAN:   #1 patient will continue Arimidex 1 mg daily. I did recommend the possibility of first switching to Aromasin but patient declined.  #2 patient will complete it to be seen on a yearly basis. I have  set her up an appointment to see me back in 2015.  #3 her daughter knows to call me with any problems.   All questions were answered. The patient knows to call the clinic with any problems, questions or concerns. We can certainly see the patient much sooner if necessary.  I spent 25 minutes counseling the patient face to face. The total time spent in the appointment was 30 minutes.    Drue Second, MD Medical/Oncology [facility] [phone] (beeper) [phone] (Office)  06/03/2012, 9:24 PM

## 2012-06-03 NOTE — Progress Notes (Signed)
Checked in new patient. No financial issues. °

## 2012-06-03 NOTE — Telephone Encounter (Signed)
appts made and printed...td 

## 2012-09-15 DEATH — deceased

## 2013-05-21 ENCOUNTER — Telehealth: Payer: Self-pay

## 2013-05-21 NOTE — Telephone Encounter (Signed)
LMOVM - KK out on LOA.  Appt rescheduled to 5/21 lab 215, [place] at 245.  Pt to call clinic if need to reschedule.

## 2013-06-04 ENCOUNTER — Other Ambulatory Visit: Payer: Medicare Other

## 2013-06-04 ENCOUNTER — Ambulatory Visit: Payer: Medicare Other | Admitting: Oncology
